# Patient Record
Sex: Female | Born: 1970 | Race: White | Hispanic: No | Marital: Married | Smoking: Never smoker
Health system: Southern US, Community
[De-identification: ages and names within clinical notes are randomized; demographics above are authoritative.]

## PROBLEM LIST (undated history)

## (undated) DIAGNOSIS — E039 Hypothyroidism, unspecified: Secondary | ICD-10-CM

## (undated) DIAGNOSIS — E785 Hyperlipidemia, unspecified: Secondary | ICD-10-CM

## (undated) DIAGNOSIS — L4050 Arthropathic psoriasis, unspecified: Secondary | ICD-10-CM

## (undated) DIAGNOSIS — E079 Disorder of thyroid, unspecified: Secondary | ICD-10-CM

## (undated) HISTORY — DX: Disorder of thyroid, unspecified: E07.9

## (undated) HISTORY — PX: BREAST REDUCTION SURGERY: SHX8

## (undated) HISTORY — DX: Hyperlipidemia, unspecified: E78.5

## (undated) HISTORY — PX: ANTERIOR CRUCIATE LIGAMENT REPAIR: SHX115

## (undated) HISTORY — PX: APPENDECTOMY: SHX54

---

## 1998-12-06 HISTORY — PX: REDUCTION MAMMAPLASTY: SUR839

## 2002-07-20 ENCOUNTER — Other Ambulatory Visit: Admission: RE | Admit: 2002-07-20 | Discharge: 2002-07-20 | Payer: Self-pay | Admitting: Family Medicine

## 2002-07-23 ENCOUNTER — Other Ambulatory Visit: Admission: RE | Admit: 2002-07-23 | Discharge: 2002-07-23 | Payer: Self-pay | Admitting: Family Medicine

## 2012-02-11 ENCOUNTER — Ambulatory Visit
Admission: RE | Admit: 2012-02-11 | Discharge: 2012-02-11 | Disposition: A | Discharge status: Home / Self Care | Payer: 59 | Source: Ambulatory Visit | Attending: Internal Medicine | Admitting: Internal Medicine

## 2012-02-11 ENCOUNTER — Other Ambulatory Visit: Payer: Self-pay | Admitting: Internal Medicine

## 2012-02-11 DIAGNOSIS — Z79899 Other long term (current) drug therapy: Secondary | ICD-10-CM

## 2013-08-09 ENCOUNTER — Other Ambulatory Visit: Payer: Self-pay

## 2013-08-09 DIAGNOSIS — Z1231 Encounter for screening mammogram for malignant neoplasm of breast: Secondary | ICD-10-CM

## 2013-08-22 ENCOUNTER — Ambulatory Visit
Admission: RE | Admit: 2013-08-22 | Discharge: 2013-08-22 | Disposition: A | Discharge status: Home / Self Care | Payer: 59 | Source: Ambulatory Visit

## 2013-08-22 DIAGNOSIS — Z1231 Encounter for screening mammogram for malignant neoplasm of breast: Secondary | ICD-10-CM

## 2013-08-24 ENCOUNTER — Ambulatory Visit: Payer: 59

## 2013-11-26 ENCOUNTER — Encounter: Payer: Self-pay | Admitting: Physician Assistant

## 2013-11-26 ENCOUNTER — Ambulatory Visit (INDEPENDENT_AMBULATORY_CARE_PROVIDER_SITE_OTHER): Payer: 59 | Admitting: Physician Assistant

## 2013-11-26 VITALS — BP 132/80 | HR 89 | Ht 68.0 in | Wt 179.0 lb

## 2013-11-26 DIAGNOSIS — E039 Hypothyroidism, unspecified: Secondary | ICD-10-CM | POA: Insufficient documentation

## 2013-11-26 DIAGNOSIS — Z79899 Other long term (current) drug therapy: Secondary | ICD-10-CM

## 2013-11-26 DIAGNOSIS — E785 Hyperlipidemia, unspecified: Secondary | ICD-10-CM

## 2013-11-26 DIAGNOSIS — G4700 Insomnia, unspecified: Secondary | ICD-10-CM

## 2013-11-26 MED ORDER — LEVOTHYROXINE SODIUM 88 MCG PO TABS: 88.0000 ug | ORAL_TABLET | Freq: Every day | ORAL | Status: DC

## 2013-11-26 MED ORDER — ZOLPIDEM TARTRATE 10 MG PO TABS: 10.0000 mg | ORAL_TABLET | Freq: Every evening | ORAL | Status: DC | PRN

## 2013-11-27 LAB — COMPLETE METABOLIC PANEL WITH GFR
ALT: 22 U/L (ref 0–35)
AST: 18 U/L (ref 0–37)
Alkaline Phosphatase: 64 U/L (ref 39–117)
CO2: 25 mEq/L (ref 19–32)
Calcium: 9.5 mg/dL (ref 8.4–10.5)
GFR, Est Non African American: >89 mL/min
Glucose, Bld: 92 mg/dL (ref 70–99)
Potassium: 4.6 mEq/L (ref 3.5–5.3)
Sodium: 136 mEq/L (ref 135–145)
Total Bilirubin: 0.4 mg/dL (ref 0.3–1.2)
Total Protein: 7.1 g/dL (ref 6.0–8.3)

## 2013-11-27 LAB — TSH: TSH: 2.255 u[IU]/mL (ref 0.350–4.500)

## 2013-11-27 LAB — LIPID PANEL
HDL: 65 mg/dL (ref >39)
LDL Cholesterol: 129 mg/dL — ABNORMAL HIGH (ref 0–99)
Total CHOL/HDL Ratio: 3.4 Ratio
VLDL: 30 mg/dL (ref 0–40)

## 2013-11-28 NOTE — Progress Notes (Signed)
   Subjective:    Patient ID: Summer Santiago, female    DOB: 08-16-1971, 42 y.o.   MRN: [id number]  HPI Patient is a 42 yo WF who presents to the clinic to establish care. Pt needs meds refilled and to start lab work in our office.   Hypothyroidism has been controlled for many years on same dose. No compliants today. Has not been checked in a while.   Insomnia- used ambien for a while to help sleep. Travels a lot and needs it to help her sleep.   Hyperlipidemia- Has not been checked in a while. Takes zetia daily. Tried zocor and lipitor and not able to tolerate due to muscle aches and pain.   .. Family History  Problem Relation Age of Onset  . Congestive Heart Failure Mother   . Gout Father   . Diabetes Maternal Grandmother   . Diabetes Paternal Grandmother   . Cancer Paternal Grandmother     non-lymphoma of the brain  . Stroke Paternal Grandfather    .Marland Kitchen Past Surgical History  Procedure Laterality Date  . Appendectomy    . Breast reduction surgery        Review of Systems  All other systems reviewed and are negative.       Objective:   Physical Exam  Constitutional: She is oriented to person, place, and time. She appears well-developed and well-nourished.  HENT:  Head: Normocephalic and atraumatic.  Neck: Normal range of motion. Neck supple. No thyromegaly present.  Cardiovascular: Normal rate, regular rhythm and normal heart sounds.   Pulmonary/Chest: Effort normal and breath sounds normal.  Lymphadenopathy:    She has no cervical adenopathy.  Neurological: She is alert and oriented to person, place, and time.  Skin: Skin is warm and dry.  Psychiatric: She has a normal mood and affect. Her behavior is normal.          Assessment & Plan:  Hypothyroidism- Will recheck TSH today. Refilled medication. Follow up in 6 months.   Insomnia- refilled ambien.   Hyperlipidemia- Will recheck lipid level. Refilled zetia.   Flu shot and tetanus shot up to date.

## 2013-12-11 ENCOUNTER — Ambulatory Visit (INDEPENDENT_AMBULATORY_CARE_PROVIDER_SITE_OTHER): Payer: 59 | Admitting: Family Medicine

## 2013-12-11 ENCOUNTER — Encounter: Payer: Self-pay | Admitting: Family Medicine

## 2013-12-11 VITALS — BP 143/100 | Temp 97.6°F | Wt 180.0 lb

## 2013-12-11 DIAGNOSIS — J329 Chronic sinusitis, unspecified: Secondary | ICD-10-CM

## 2013-12-11 DIAGNOSIS — B9689 Other specified bacterial agents as the cause of diseases classified elsewhere: Secondary | ICD-10-CM

## 2013-12-11 DIAGNOSIS — A499 Bacterial infection, unspecified: Secondary | ICD-10-CM

## 2013-12-11 MED ORDER — AMOXICILLIN-POT CLAVULANATE 500-125 MG PO TABS: ORAL_TABLET | ORAL | Status: AC

## 2013-12-11 NOTE — Progress Notes (Signed)
CC: Summer Santiago is a 43 y.o. female is here for Cough and Nasal Congestion   Subjective: HPI:  Facial pressure underneath the left eye radiating into the left upper molars that has been present for the past 3 weeks waxing and waning worsening on a daily basis for the past week. Accompanied with thick nasal discharge with subjective postnasal drip.  Nonproductive cough all the symptoms are worse in the evening when lying down flat. No 2 minimal improvement with DayQuil and NyQuil.  Reports fatigue but denies fevers, chills, shortness of breath, wheezing, chest pain nor motor or sensory disturbances   Review Of Systems Outlined In HPI  Past Medical History  Diagnosis Date  . Hyperlipidemia   . Thyroid disease      Family History  Problem Relation Age of Onset  . Congestive Heart Failure Mother   . Gout Father   . Diabetes Maternal Grandmother   . Diabetes Paternal Grandmother   . Cancer Paternal Grandmother     non-lymphoma of the brain  . Stroke Paternal Grandfather      History  Substance Use Topics  . Smoking status: Never Smoker   . Smokeless tobacco: Not on file  . Alcohol Use: Yes     Objective: Filed Vitals:   12/11/13 0901  BP: 143/100  Temp: 97.6 F (36.4 C)    General: Alert and Oriented, No Acute Distress HEENT: Pupils equal, round, reactive to light. Conjunctivae clear.  External ears unremarkable, canals clear with intact TMs with appropriate landmarks.  Middle ear appears open without effusion. Pink inferior turbinates.  Moist mucous membranes, pharynx without inflammation nor lesions however moderate cobblestoning and postnasal drip.  Neck supple without palpable lymphadenopathy nor abnormal masses. Maxillary sinus tenderness Lungs: Clear to auscultation bilaterally, no wheezing/ronchi/rales.  Comfortable work of breathing. Good air movement. Cardiac: Regular rate and rhythm. Normal S1/S2.  No murmurs, rubs, nor gallops.   Mental Status: No depression,  anxiety, nor agitation. Skin: Warm and dry.  Assessment & Plan: Summer Santiago was seen today for cough and nasal congestion.  Diagnoses and associated orders for this visit:  Bacterial sinusitis - amoxicillin-clavulanate (AUGMENTIN) 500-125 MG per tablet; Take one by mouth every 8 hours for ten total days.     bacterial sinsitis: Start Augmentin consider nasal saline washes would agree with continuing DayQuil and NyQuil as needed  Return if symptoms worsen or fail to improve.

## 2013-12-27 ENCOUNTER — Encounter: Payer: Self-pay | Admitting: Physician Assistant

## 2013-12-27 DIAGNOSIS — E559 Vitamin D deficiency, unspecified: Secondary | ICD-10-CM | POA: Insufficient documentation

## 2014-01-30 ENCOUNTER — Telehealth: Payer: Self-pay | Admitting: *Deleted

## 2014-01-30 NOTE — Telephone Encounter (Signed)
Prior auth approved for Zetia through [facility]. Auth approved from 01/29/14-01/29/15.

## 2014-04-07 ENCOUNTER — Other Ambulatory Visit: Payer: Self-pay | Admitting: Physician Assistant

## 2014-05-20 ENCOUNTER — Ambulatory Visit: Payer: 59 | Admitting: Physician Assistant

## 2014-05-24 ENCOUNTER — Ambulatory Visit: Payer: 59 | Admitting: Physician Assistant

## 2014-05-31 ENCOUNTER — Ambulatory Visit: Payer: 59 | Admitting: Physician Assistant

## 2014-06-06 ENCOUNTER — Other Ambulatory Visit: Payer: Self-pay | Admitting: Physician Assistant

## 2014-06-10 ENCOUNTER — Ambulatory Visit (INDEPENDENT_AMBULATORY_CARE_PROVIDER_SITE_OTHER): Payer: 59 | Admitting: Physician Assistant

## 2014-06-10 ENCOUNTER — Encounter: Payer: Self-pay | Admitting: Physician Assistant

## 2014-06-10 VITALS — BP 125/86 | HR 85 | Ht 68.0 in | Wt 181.0 lb

## 2014-06-10 DIAGNOSIS — G4700 Insomnia, unspecified: Secondary | ICD-10-CM

## 2014-06-10 DIAGNOSIS — E039 Hypothyroidism, unspecified: Secondary | ICD-10-CM

## 2014-06-10 DIAGNOSIS — E785 Hyperlipidemia, unspecified: Secondary | ICD-10-CM

## 2014-06-10 MED ORDER — ZOLPIDEM TARTRATE 10 MG PO TABS: 10.0000 mg | ORAL_TABLET | Freq: Every evening | ORAL | Status: DC | PRN

## 2014-06-10 MED ORDER — EZETIMIBE 10 MG PO TABS: 10.0000 mg | ORAL_TABLET | Freq: Every day | ORAL | Status: DC

## 2014-06-10 NOTE — Progress Notes (Signed)
   Subjective:    Patient ID: Summer Santiago, female    DOB: 03/28/1971, 43 y.o.   MRN: [id number]  HPI  Patient is a 43 year old female who presents to the clinic for 6 month followup.  Hypothyroidism-patient has no concerns or complaints today. She continues to take Synthroid 88 MCG use daily.  Hyperlipidemia-patient has intolerance to statins. Currently owns and fish oil. Last LDL was 129. She does need refills of that area today.  Insomnia-patient is well controlled on Ambien 10 mg nightly. She needs refill today.   Review of Systems  All other systems reviewed and are negative.      Objective:   Physical Exam  Constitutional: She appears well-developed and well-nourished.  HENT:  Head: Normocephalic and atraumatic.  Cardiovascular: Normal rate, regular rhythm and normal heart sounds.   Psychiatric: She has a normal mood and affect. Her behavior is normal.          Assessment & Plan:  Hypothyroidism-we'll recheck thyroid levels today. We'll refill accordingly.  Hyperlipidemia-refilled setting up for 6 month. We'll recheck cholesterol in December.  Insomnia-refilled Ambien. Patient needed a 30 day supply to get filled now and the rest to care [employer] [facility] order pharmacy.  Elevated blood pressure-rechecked and was in normal range at 6 recheck.

## 2014-06-11 ENCOUNTER — Other Ambulatory Visit: Payer: Self-pay | Admitting: Physician Assistant

## 2014-06-11 LAB — T4, FREE: FREE T4: 1.16 ng/dL (ref 0.80–1.80)

## 2014-06-11 LAB — TSH: TSH: 3.771 u[IU]/mL (ref 0.350–4.500)

## 2014-06-11 MED ORDER — LEVOTHYROXINE SODIUM 88 MCG PO TABS: 88.0000 ug | ORAL_TABLET | Freq: Every day | ORAL | Status: DC

## 2014-11-13 ENCOUNTER — Encounter: Payer: Self-pay | Admitting: Physician Assistant

## 2014-11-13 ENCOUNTER — Telehealth: Payer: Self-pay | Admitting: *Deleted

## 2014-11-13 ENCOUNTER — Ambulatory Visit (INDEPENDENT_AMBULATORY_CARE_PROVIDER_SITE_OTHER): Payer: 59 | Admitting: Physician Assistant

## 2014-11-13 VITALS — BP 134/81 | HR 50 | Ht 68.0 in | Wt 166.0 lb

## 2014-11-13 DIAGNOSIS — G4700 Insomnia, unspecified: Secondary | ICD-10-CM

## 2014-11-13 DIAGNOSIS — L719 Rosacea, unspecified: Secondary | ICD-10-CM

## 2014-11-13 DIAGNOSIS — E039 Hypothyroidism, unspecified: Secondary | ICD-10-CM

## 2014-11-13 DIAGNOSIS — Z0000 Encounter for general adult medical examination without abnormal findings: Secondary | ICD-10-CM

## 2014-11-13 LAB — COMPLETE METABOLIC PANEL WITH GFR
ALT: 20 U/L (ref 0–35)
AST: 17 U/L (ref 0–37)
Albumin: 4.1 g/dL (ref 3.5–5.2)
Alkaline Phosphatase: 66 U/L (ref 39–117)
BUN: 12 mg/dL (ref 6–23)
CALCIUM: 9 mg/dL (ref 8.4–10.5)
CHLORIDE: 103 meq/L (ref 96–112)
CO2: 24 mEq/L (ref 19–32)
Creat: 0.79 mg/dL (ref 0.50–1.10)
GFR, Est African American: >89 mL/min
GFR, Est Non African American: >89 mL/min
Glucose, Bld: 83 mg/dL (ref 70–99)
POTASSIUM: 4.4 meq/L (ref 3.5–5.3)
SODIUM: 138 meq/L (ref 135–145)
Total Bilirubin: 0.4 mg/dL (ref 0.2–1.2)
Total Protein: 7 g/dL (ref 6.0–8.3)

## 2014-11-13 LAB — LIPID PANEL
CHOLESTEROL: 191 mg/dL (ref 0–200)
HDL: 73 mg/dL (ref >39)
LDL Cholesterol: 102 mg/dL — ABNORMAL HIGH (ref 0–99)
Total CHOL/HDL Ratio: 2.6 Ratio
Triglycerides: 80 mg/dL (ref <150)
VLDL: 16 mg/dL (ref 0–40)

## 2014-11-13 LAB — TSH: TSH: 5.404 u[IU]/mL — ABNORMAL HIGH (ref 0.350–4.500)

## 2014-11-13 LAB — T4, FREE: FREE T4: 1.27 ng/dL (ref 0.80–1.80)

## 2014-11-13 MED ORDER — ZOLPIDEM TARTRATE 10 MG PO TABS: 10.0000 mg | ORAL_TABLET | Freq: Every evening | ORAL | Status: DC | PRN

## 2014-11-13 NOTE — Progress Notes (Signed)
   Subjective:    Patient ID: Summer Santiago, female    DOB: [birth year], 43 y.o.   MRN: [id number]  HPI  Pt presents to the clinic for 6 month follow up and medication refill.   Hypothyroidism- doing great. No concerns. Last checked 6 months ago.   Insomnia- still uses ambien nightly. Needs refills. Travels a lot and has trouble sleeping.   Review of Systems  All other systems reviewed and are negative.      Objective:   Physical Exam  Constitutional: She is oriented to person, place, and time. She appears well-developed and well-nourished.  HENT:  Head: Normocephalic and atraumatic.  Neck: Normal range of motion. Neck supple. No thyromegaly present.  Cardiovascular: Normal rate, regular rhythm and normal heart sounds.   Pulmonary/Chest: Effort normal and breath sounds normal. She has no wheezes.  Neurological: She is alert and oriented to person, place, and time.  Skin: Skin is dry.  Psychiatric: She has a normal mood and affect. Her behavior is normal.          Assessment & Plan:  Hypothyroidism- will recheck labs and adjust medication accordingly.   Insomnia- refilled for 6 months.   Rosacea- new dx. Managed by dermatology.    Needs CPE and mammogram. Pt has gotten letter from breast clinic she just needs to reschedule.

## 2014-11-13 NOTE — Telephone Encounter (Signed)
Fasting labs ordered

## 2014-11-19 ENCOUNTER — Other Ambulatory Visit: Payer: Self-pay | Admitting: *Deleted

## 2014-11-19 MED ORDER — EZETIMIBE 10 MG PO TABS: 10.0000 mg | ORAL_TABLET | Freq: Every day | ORAL | Status: DC

## 2014-11-19 MED ORDER — LEVOTHYROXINE SODIUM 88 MCG PO TABS: 88.0000 ug | ORAL_TABLET | Freq: Every day | ORAL | Status: DC

## 2015-01-17 ENCOUNTER — Other Ambulatory Visit: Payer: Self-pay | Admitting: *Deleted

## 2015-01-17 MED ORDER — ZOLPIDEM TARTRATE 10 MG PO TABS: 10.0000 mg | ORAL_TABLET | Freq: Every evening | ORAL | Status: DC | PRN

## 2015-02-14 ENCOUNTER — Ambulatory Visit (INDEPENDENT_AMBULATORY_CARE_PROVIDER_SITE_OTHER): Payer: 59 | Admitting: Physician Assistant

## 2015-02-14 ENCOUNTER — Encounter: Payer: Self-pay | Admitting: Physician Assistant

## 2015-02-14 VITALS — BP 142/85 | HR 56 | Ht 68.0 in | Wt 164.0 lb

## 2015-02-14 DIAGNOSIS — R1900 Intra-abdominal and pelvic swelling, mass and lump, unspecified site: Secondary | ICD-10-CM

## 2015-02-14 NOTE — Progress Notes (Signed)
   Subjective:    Patient ID: Summer Santiago, female    DOB: [birth year], 44 y.o.   MRN: [id number]  HPI  Pt presents to the clinic with a pelvic mass for last month. Per pt seems about the size of a baseball and moves when her bladder is full. No pain associated or tenderness to palpation. No urinary symptoms. No vaginal bleeding except for normal menstrual cycle with some clots that is unchanged. No fever, chills, night sweats. Unaware if increasing in size or not.    Review of Systems  All other systems reviewed and are negative.      Objective:   Physical Exam  Constitutional: She is oriented to person, place, and time. She appears well-developed and well-nourished.  HENT:  Head: Normocephalic and atraumatic.  Cardiovascular: Normal rate, regular rhythm and normal heart sounds.   Pulmonary/Chest: Effort normal and breath sounds normal. She has no wheezes.  Abdominal:  Pelvic mass approximately 5cm by 4cm lower abdomen slightly to the right. Not tender.   Neurological: She is alert and oriented to person, place, and time.  Skin: Skin is dry.  Psychiatric: She has a normal mood and affect. Her behavior is normal.          Assessment & Plan:  Pelvic mass- will get pelvic and transvaginal ultrasound. Does not feel like lymphnode. Unclear etiology could be fibroid vs other mass. Follow up after ultrasound as directed by results.

## 2015-02-28 ENCOUNTER — Ambulatory Visit
Admission: RE | Admit: 2015-02-28 | Discharge: 2015-02-28 | Disposition: A | Discharge status: Home / Self Care | Payer: 59 | Source: Ambulatory Visit | Attending: Physician Assistant | Admitting: Physician Assistant

## 2015-02-28 ENCOUNTER — Ambulatory Visit: Admission: RE | Admit: 2015-02-28 | Payer: 59 | Source: Ambulatory Visit

## 2015-03-03 ENCOUNTER — Encounter: Payer: Self-pay | Admitting: Physician Assistant

## 2015-03-03 DIAGNOSIS — D259 Leiomyoma of uterus, unspecified: Secondary | ICD-10-CM | POA: Insufficient documentation

## 2015-03-04 ENCOUNTER — Other Ambulatory Visit: Payer: Self-pay | Admitting: Physician Assistant

## 2015-03-04 DIAGNOSIS — D259 Leiomyoma of uterus, unspecified: Secondary | ICD-10-CM

## 2015-04-04 ENCOUNTER — Other Ambulatory Visit: Payer: Self-pay

## 2015-04-04 DIAGNOSIS — Z1231 Encounter for screening mammogram for malignant neoplasm of breast: Secondary | ICD-10-CM

## 2015-04-10 ENCOUNTER — Ambulatory Visit
Admission: RE | Admit: 2015-04-10 | Discharge: 2015-04-10 | Disposition: A | Discharge status: Home / Self Care | Payer: 59 | Source: Ambulatory Visit

## 2015-04-10 DIAGNOSIS — Z1231 Encounter for screening mammogram for malignant neoplasm of breast: Secondary | ICD-10-CM

## 2015-04-24 ENCOUNTER — Telehealth: Payer: Self-pay | Admitting: Physician Assistant

## 2015-04-24 NOTE — Telephone Encounter (Signed)
Received fax from [facility] and Zetia was approved from 04/23/2015 -04/22/2016. 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 - CF

## 2015-04-28 ENCOUNTER — Other Ambulatory Visit: Payer: Self-pay | Admitting: Obstetrics

## 2015-05-01 NOTE — Patient Instructions (Addendum)
   Your procedure is scheduled on:  Friday, June 3  Enter through the [employer] [facility] at: 7 AM Pick up the phone at the desk and dial [phone] and inform us of your arrival.  Please call this number if you have any problems the morning of surgery: [phone]  Remember: Do not eat or drink after midnight: Thursday Take these medicines the morning of surgery with a SIP OF WATER:  None  Do not wear jewelry, make-up, or FINGER nail polish No metal in your hair or on your body. Do not wear lotions, powders, perfumes.  You may wear deodorant.  Do not bring valuables to the hospital.  Leave suitcase in the car. After Surgery it may be brought to your room. For patients being admitted to the hospital, checkout time is 11:00am the day of discharge.  Home with husband BOB cell [phone]

## 2015-05-02 ENCOUNTER — Encounter (HOSPITAL_COMMUNITY): Payer: Self-pay

## 2015-05-02 ENCOUNTER — Encounter (HOSPITAL_COMMUNITY)
Admission: RE | Admit: 2015-05-02 | Discharge: 2015-05-02 | Disposition: A | Discharge status: Home / Self Care | Payer: 59 | Source: Ambulatory Visit | Attending: Obstetrics | Admitting: Obstetrics

## 2015-05-02 DIAGNOSIS — E039 Hypothyroidism, unspecified: Secondary | ICD-10-CM | POA: Diagnosis not present

## 2015-05-02 DIAGNOSIS — E785 Hyperlipidemia, unspecified: Secondary | ICD-10-CM | POA: Diagnosis not present

## 2015-05-02 DIAGNOSIS — E780 Pure hypercholesterolemia: Secondary | ICD-10-CM | POA: Diagnosis not present

## 2015-05-02 DIAGNOSIS — D259 Leiomyoma of uterus, unspecified: Secondary | ICD-10-CM | POA: Diagnosis present

## 2015-05-02 DIAGNOSIS — Z01818 Encounter for other preprocedural examination: Secondary | ICD-10-CM | POA: Insufficient documentation

## 2015-05-02 DIAGNOSIS — Z79899 Other long term (current) drug therapy: Secondary | ICD-10-CM | POA: Diagnosis not present

## 2015-05-02 HISTORY — DX: Hypothyroidism, unspecified: E03.9

## 2015-05-02 LAB — CBC
HCT: 41.7 % (ref 36.0–46.0)
Hemoglobin: 14.8 g/dL (ref 12.0–15.0)
MCH: 32.5 pg (ref 26.0–34.0)
MCHC: 35.5 g/dL (ref 30.0–36.0)
MCV: 91.4 fL (ref 78.0–100.0)
PLATELETS: 198 10*3/uL (ref 150–400)
RBC: 4.56 MIL/uL (ref 3.87–5.11)
RDW: 14.9 % (ref 11.5–15.5)
WBC: 8 10*3/uL (ref 4.0–10.5)

## 2015-05-02 LAB — TYPE AND SCREEN
ABO/RH(D): O POS
Antibody Screen: NEGATIVE

## 2015-05-02 LAB — ABO/RH: ABO/RH(D): O POS

## 2015-05-08 NOTE — H&P (Signed)
CC: fibroid uterus  HPI: 44 yo  G0, not desiring fertility, with 3 month history of palpable abdominal mass. Also w/ h/o heavy but regular 5 day menses. No anemia, no dyspareunia. Pt bothered by mass effect. U/s reveals 14 x7 x 9 cm uterus with central fibroi 8x6x8 cm. Imaging and labs 4/16. Hgb 13+, nl pap, HPV neg, nl embx.  FH: no FH breast, ovarian, colon CA PMH: hypothyroid, hyperchol PSH: knee, breast reduction, appendectomy.   Meds: Ambien, Synthroid, Zetia.  Past Medical History  Diagnosis Date  . Hyperlipidemia   . Thyroid disease   . Hypothyroidism     Past Surgical History  Procedure Laterality Date  . Appendectomy    . Breast reduction surgery    . Anterior cruciate ligament repair      right     PE:  Filed Vitals:   05/09/15 0713  BP: 159/84  Pulse: 69  Temp: 98.4 F (36.9 C)  TempSrc: Oral  Resp: 16  SpO2: 100%   Gen: well appearing, no distress Abd: soft, NT LE: Nt, no edema GU: def to OR   CBC    Component Value Date/Time   WBC 8.0 05/02/2015 1545   RBC 4.56 05/02/2015 1545   HGB 14.8 05/02/2015 1545   HCT 41.7 05/02/2015 1545   PLT 198 05/02/2015 1545   MCV 91.4 05/02/2015 1545   MCH 32.5 05/02/2015 1545   MCHC 35.5 05/02/2015 1545   RDW 14.9 05/02/2015 1545    UPT neg  A/P: Fibroid with mass effect, Pt opting for surgical management. Pt aware R/B of surgery including need for exploratory laparotomy with increased recovery time, blood loss, pain; bleeding, infection, damage to other organs. We have also discussed that uterine sarcoma is also in the DDx- this particular tumor is usually very aggressive, with a lower life expectancy than many gyn cancers. Pt is aware of the option for MRI and gyn oncology consultation pre-op but has declines these options. We have discussed the risks of inadvertant spread of a possible sarcoma with l'scopic approach and with power morcellation. Open approach would be the alternative but pt declines as she is  hoping for a minimally invlasive surgery with less recovery time. Pt opts for power morcellation in the event the uterus cannot be removed vaginally.

## 2015-05-09 ENCOUNTER — Ambulatory Visit (HOSPITAL_COMMUNITY)
Admission: RE | Admit: 2015-05-09 | Discharge: 2015-05-10 | Disposition: A | Discharge status: Home / Self Care | Payer: 59 | Source: Ambulatory Visit | Attending: Obstetrics | Admitting: Obstetrics

## 2015-05-09 ENCOUNTER — Encounter (HOSPITAL_COMMUNITY)
Admission: RE | Disposition: A | Discharge status: Home / Self Care | Payer: Self-pay | Source: Ambulatory Visit | Attending: Obstetrics

## 2015-05-09 ENCOUNTER — Ambulatory Visit (HOSPITAL_COMMUNITY): Payer: 59 | Admitting: Anesthesiology

## 2015-05-09 ENCOUNTER — Encounter (HOSPITAL_COMMUNITY): Payer: Self-pay | Admitting: Anesthesiology

## 2015-05-09 DIAGNOSIS — E785 Hyperlipidemia, unspecified: Secondary | ICD-10-CM | POA: Insufficient documentation

## 2015-05-09 DIAGNOSIS — D259 Leiomyoma of uterus, unspecified: Secondary | ICD-10-CM | POA: Diagnosis not present

## 2015-05-09 DIAGNOSIS — Z79899 Other long term (current) drug therapy: Secondary | ICD-10-CM | POA: Insufficient documentation

## 2015-05-09 DIAGNOSIS — E780 Pure hypercholesterolemia: Secondary | ICD-10-CM | POA: Insufficient documentation

## 2015-05-09 DIAGNOSIS — E039 Hypothyroidism, unspecified: Secondary | ICD-10-CM | POA: Insufficient documentation

## 2015-05-09 DIAGNOSIS — D219 Benign neoplasm of connective and other soft tissue, unspecified: Secondary | ICD-10-CM | POA: Diagnosis present

## 2015-05-09 HISTORY — PX: BILATERAL SALPINGECTOMY: SHX5743

## 2015-05-09 HISTORY — PX: ROBOTIC ASSISTED TOTAL HYSTERECTOMY: SHX6085

## 2015-05-09 LAB — TYPE AND SCREEN
ABO/RH(D): O POS
Antibody Screen: NEGATIVE

## 2015-05-09 LAB — PREGNANCY, URINE: PREG TEST UR: NEGATIVE

## 2015-05-09 SURGERY — ROBOTIC ASSISTED TOTAL HYSTERECTOMY
Anesthesia: General | Site: Abdomen

## 2015-05-09 MED ORDER — HYDROCODONE-ACETAMINOPHEN 7.5-325 MG PO TABS: 1.0000 | ORAL_TABLET | Freq: Once | ORAL | Status: DC | PRN

## 2015-05-09 MED ORDER — CEFAZOLIN SODIUM-DEXTROSE 2-3 GM-% IV SOLR
2.0000 g | INTRAVENOUS | Status: AC
  Administered 2015-05-09: 2 g via INTRAVENOUS

## 2015-05-09 MED ORDER — FENTANYL CITRATE (PF) 250 MCG/5ML IJ SOLN
INTRAMUSCULAR | Status: AC
  Filled 2015-05-09: qty 5

## 2015-05-09 MED ORDER — NEOSTIGMINE METHYLSULFATE 10 MG/10ML IV SOLN
INTRAVENOUS | Status: DC | PRN
  Administered 2015-05-09: 4 mg via INTRAVENOUS

## 2015-05-09 MED ORDER — HYDROMORPHONE HCL 1 MG/ML IJ SOLN
0.2500 mg | INTRAMUSCULAR | Status: DC | PRN
  Administered 2015-05-09 (×2): 0.5 mg via INTRAVENOUS

## 2015-05-09 MED ORDER — ZOLPIDEM TARTRATE 5 MG PO TABS: 5.0000 mg | ORAL_TABLET | Freq: Every evening | ORAL | Status: DC | PRN

## 2015-05-09 MED ORDER — SODIUM CHLORIDE 0.9 % IV SOLN
INTRAVENOUS | Status: DC
  Administered 2015-05-09 – 2015-05-10 (×2): via INTRAVENOUS

## 2015-05-09 MED ORDER — SIMETHICONE 80 MG PO CHEW: 80.0000 mg | CHEWABLE_TABLET | Freq: Four times a day (QID) | ORAL | Status: DC | PRN

## 2015-05-09 MED ORDER — SODIUM CHLORIDE 0.9 % IV SOLN
INTRAVENOUS | Status: DC | PRN
  Administered 2015-05-09: 60 mL

## 2015-05-09 MED ORDER — MENTHOL 3 MG MT LOZG: 1.0000 | LOZENGE | OROMUCOSAL | Status: DC | PRN

## 2015-05-09 MED ORDER — ROPIVACAINE HCL 5 MG/ML IJ SOLN
INTRAMUSCULAR | Status: AC
  Filled 2015-05-09: qty 30

## 2015-05-09 MED ORDER — OXYCODONE-ACETAMINOPHEN 5-325 MG PO TABS
1.0000 | ORAL_TABLET | ORAL | Status: DC | PRN
  Administered 2015-05-10: 1 via ORAL
  Filled 2015-05-09: qty 1

## 2015-05-09 MED ORDER — ROCURONIUM BROMIDE 100 MG/10ML IV SOLN
INTRAVENOUS | Status: DC | PRN
  Administered 2015-05-09: 5 mg via INTRAVENOUS
  Administered 2015-05-09 (×2): 20 mg via INTRAVENOUS
  Administered 2015-05-09: 5 mg via INTRAVENOUS
  Administered 2015-05-09: 10 mg via INTRAVENOUS
  Administered 2015-05-09: 45 mg via INTRAVENOUS

## 2015-05-09 MED ORDER — IBUPROFEN 600 MG PO TABS: 600.0000 mg | ORAL_TABLET | Freq: Four times a day (QID) | ORAL | Status: DC | PRN

## 2015-05-09 MED ORDER — ONDANSETRON HCL 4 MG/2ML IJ SOLN
INTRAMUSCULAR | Status: AC
  Filled 2015-05-09: qty 2

## 2015-05-09 MED ORDER — BUPIVACAINE HCL (PF) 0.25 % IJ SOLN
INTRAMUSCULAR | Status: AC
  Filled 2015-05-09: qty 30

## 2015-05-09 MED ORDER — LABETALOL HCL 5 MG/ML IV SOLN
INTRAVENOUS | Status: AC
Start: 2015-05-09 — End: 2015-05-09
  Filled 2015-05-09: qty 4

## 2015-05-09 MED ORDER — ONDANSETRON HCL 4 MG/2ML IJ SOLN
INTRAMUSCULAR | Status: DC | PRN
  Administered 2015-05-09: 4 mg via INTRAVENOUS

## 2015-05-09 MED ORDER — LIDOCAINE HCL (CARDIAC) 20 MG/ML IV SOLN
INTRAVENOUS | Status: AC
  Filled 2015-05-09: qty 5

## 2015-05-09 MED ORDER — ARTIFICIAL TEARS OP OINT
TOPICAL_OINTMENT | OPHTHALMIC | Status: AC
  Filled 2015-05-09: qty 3.5

## 2015-05-09 MED ORDER — SODIUM CHLORIDE 0.9 % IJ SOLN
INTRAMUSCULAR | Status: AC
  Filled 2015-05-09: qty 50

## 2015-05-09 MED ORDER — ONDANSETRON HCL 4 MG/2ML IJ SOLN: 4.0000 mg | Freq: Four times a day (QID) | INTRAMUSCULAR | Status: DC | PRN

## 2015-05-09 MED ORDER — BUPIVACAINE HCL (PF) 0.25 % IJ SOLN
INTRAMUSCULAR | Status: DC | PRN
  Administered 2015-05-09: 30 mL

## 2015-05-09 MED ORDER — SCOPOLAMINE 1 MG/3DAYS TD PT72
MEDICATED_PATCH | TRANSDERMAL | Status: AC
  Administered 2015-05-09: 1.5 mg via TRANSDERMAL
  Filled 2015-05-09: qty 1

## 2015-05-09 MED ORDER — ROCURONIUM BROMIDE 100 MG/10ML IV SOLN
INTRAVENOUS | Status: AC
  Filled 2015-05-09: qty 1

## 2015-05-09 MED ORDER — MIDAZOLAM HCL 2 MG/2ML IJ SOLN
INTRAMUSCULAR | Status: AC
  Filled 2015-05-09: qty 2

## 2015-05-09 MED ORDER — FENTANYL CITRATE (PF) 100 MCG/2ML IJ SOLN
INTRAMUSCULAR | Status: DC | PRN
  Administered 2015-05-09 (×3): 50 ug via INTRAVENOUS
  Administered 2015-05-09 (×3): 100 ug via INTRAVENOUS
  Administered 2015-05-09: 50 ug via INTRAVENOUS

## 2015-05-09 MED ORDER — METOCLOPRAMIDE HCL 5 MG/ML IJ SOLN: 10.0000 mg | Freq: Once | INTRAMUSCULAR | Status: DC | PRN

## 2015-05-09 MED ORDER — PROPOFOL 10 MG/ML IV BOLUS
INTRAVENOUS | Status: AC
  Filled 2015-05-09: qty 20

## 2015-05-09 MED ORDER — MIDAZOLAM HCL 5 MG/5ML IJ SOLN
INTRAMUSCULAR | Status: DC | PRN
  Administered 2015-05-09: 2 mg via INTRAVENOUS

## 2015-05-09 MED ORDER — PROPOFOL INFUSION 10 MG/ML OPTIME
INTRAVENOUS | Status: DC | PRN
  Administered 2015-05-09: 15 mL via INTRAVENOUS

## 2015-05-09 MED ORDER — HYDROMORPHONE HCL 1 MG/ML IJ SOLN
INTRAMUSCULAR | Status: AC
  Filled 2015-05-09: qty 1

## 2015-05-09 MED ORDER — LACTATED RINGERS IR SOLN
Status: DC | PRN
  Administered 2015-05-09: 3000 mL

## 2015-05-09 MED ORDER — LACTATED RINGERS IV SOLN
INTRAVENOUS | Status: DC
  Administered 2015-05-09 (×3): via INTRAVENOUS

## 2015-05-09 MED ORDER — MEPERIDINE HCL 25 MG/ML IJ SOLN: 6.2500 mg | INTRAMUSCULAR | Status: DC | PRN

## 2015-05-09 MED ORDER — NEOSTIGMINE METHYLSULFATE 10 MG/10ML IV SOLN
INTRAVENOUS | Status: AC
  Filled 2015-05-09: qty 1

## 2015-05-09 MED ORDER — KETOROLAC TROMETHAMINE 30 MG/ML IJ SOLN: 30.0000 mg | Freq: Four times a day (QID) | INTRAMUSCULAR | Status: DC

## 2015-05-09 MED ORDER — BUPIVACAINE-EPINEPHRINE (PF) 0.25% -1:200000 IJ SOLN
INTRAMUSCULAR | Status: AC
  Filled 2015-05-09: qty 30

## 2015-05-09 MED ORDER — GLYCOPYRROLATE 0.2 MG/ML IJ SOLN
INTRAMUSCULAR | Status: DC | PRN
  Administered 2015-05-09: 0.1 mg via INTRAVENOUS
  Administered 2015-05-09: 0.6 mg via INTRAVENOUS
  Administered 2015-05-09: 0.1 mg via INTRAVENOUS

## 2015-05-09 MED ORDER — PROPOFOL INFUSION 10 MG/ML OPTIME: INTRAVENOUS | Status: DC | PRN

## 2015-05-09 MED ORDER — PANTOPRAZOLE SODIUM 40 MG PO TBEC
40.0000 mg | DELAYED_RELEASE_TABLET | Freq: Every day | ORAL | Status: DC
  Administered 2015-05-09: 40 mg via ORAL
  Filled 2015-05-09 (×2): qty 1

## 2015-05-09 MED ORDER — DEXAMETHASONE SODIUM PHOSPHATE 4 MG/ML IJ SOLN
INTRAMUSCULAR | Status: DC | PRN
  Administered 2015-05-09: 10 mg via INTRAVENOUS

## 2015-05-09 MED ORDER — SCOPOLAMINE 1 MG/3DAYS TD PT72
1.0000 | MEDICATED_PATCH | Freq: Once | TRANSDERMAL | Status: DC
  Administered 2015-05-09: 1.5 mg via TRANSDERMAL

## 2015-05-09 MED ORDER — LABETALOL HCL 5 MG/ML IV SOLN
INTRAVENOUS | Status: DC | PRN
  Administered 2015-05-09 (×2): 5 mg via INTRAVENOUS

## 2015-05-09 MED ORDER — ONDANSETRON HCL 4 MG PO TABS: 4.0000 mg | ORAL_TABLET | Freq: Four times a day (QID) | ORAL | Status: DC | PRN

## 2015-05-09 MED ORDER — CEFAZOLIN SODIUM-DEXTROSE 2-3 GM-% IV SOLR
INTRAVENOUS | Status: AC
  Filled 2015-05-09: qty 50

## 2015-05-09 MED ORDER — LIDOCAINE HCL (CARDIAC) 20 MG/ML IV SOLN
INTRAVENOUS | Status: DC | PRN
  Administered 2015-05-09: 50 mg via INTRAVENOUS
  Administered 2015-05-09: 30 mg via INTRAVENOUS

## 2015-05-09 MED ORDER — KETOROLAC TROMETHAMINE 30 MG/ML IJ SOLN
30.0000 mg | Freq: Four times a day (QID) | INTRAMUSCULAR | Status: DC
  Administered 2015-05-09 – 2015-05-10 (×3): 30 mg via INTRAVENOUS
  Filled 2015-05-09 (×3): qty 1

## 2015-05-09 MED ORDER — LEVOTHYROXINE SODIUM 88 MCG PO TABS
88.0000 ug | ORAL_TABLET | Freq: Every day | ORAL | Status: DC
  Administered 2015-05-09: 88 ug via ORAL
  Filled 2015-05-09: qty 1

## 2015-05-09 MED ORDER — DEXAMETHASONE SODIUM PHOSPHATE 10 MG/ML IJ SOLN
INTRAMUSCULAR | Status: AC
  Filled 2015-05-09: qty 1

## 2015-05-09 MED ORDER — HYDROMORPHONE HCL 1 MG/ML IJ SOLN
1.0000 mg | INTRAMUSCULAR | Status: DC | PRN
  Administered 2015-05-09 – 2015-05-10 (×3): 1 mg via INTRAVENOUS
  Filled 2015-05-09 (×3): qty 1

## 2015-05-09 SURGICAL SUPPLY — 57 items
BARRIER ADHS 3X4 INTERCEED (GAUZE/BANDAGES/DRESSINGS) ×3 IMPLANT
BLADE LAP MORCELLATOR 15X9.5 (ELECTROSURGICAL) ×3 IMPLANT
CATH FOLEY 3WAY  5CC 16FR (CATHETERS) ×1
CATH FOLEY 3WAY 5CC 16FR (CATHETERS) ×2 IMPLANT
CLOTH BEACON ORANGE TIMEOUT ST (SAFETY) ×3 IMPLANT
CONT PATH 16OZ SNAP LID 3702 (MISCELLANEOUS) ×3 IMPLANT
COVER BACK TABLE 60X90IN (DRAPES) ×6 IMPLANT
COVER TIP SHEARS 8 DVNC (MISCELLANEOUS) ×2 IMPLANT
COVER TIP SHEARS 8MM DA VINCI (MISCELLANEOUS) ×1
DECANTER SPIKE VIAL GLASS SM (MISCELLANEOUS) ×3 IMPLANT
DRAPE WARM FLUID 44X44 (DRAPE) ×3 IMPLANT
DRSG OPSITE POSTOP 3X4 (GAUZE/BANDAGES/DRESSINGS) ×3 IMPLANT
DURAPREP 26ML APPLICATOR (WOUND CARE) ×3 IMPLANT
ELECT REM PT RETURN 9FT ADLT (ELECTROSURGICAL) ×3
ELECTRODE REM PT RTRN 9FT ADLT (ELECTROSURGICAL) ×2 IMPLANT
GAUZE VASELINE 3X9 (GAUZE/BANDAGES/DRESSINGS) IMPLANT
GLOVE BIO SURGEON STRL SZ 6.5 (GLOVE) ×3 IMPLANT
GLOVE BIOGEL PI IND STRL 7.0 (GLOVE) ×6 IMPLANT
GLOVE BIOGEL PI INDICATOR 7.0 (GLOVE) ×3
IV STOPCOCK 4 WAY 40  W/Y SET (IV SOLUTION)
IV STOPCOCK 4 WAY 40 W/Y SET (IV SOLUTION) IMPLANT
KIT ACCESSORY DA VINCI DISP (KITS) ×1
KIT ACCESSORY DVNC DISP (KITS) ×2 IMPLANT
LEGGING LITHOTOMY PAIR STRL (DRAPES) ×3 IMPLANT
LIQUID BAND (GAUZE/BANDAGES/DRESSINGS) ×3 IMPLANT
NEEDLE HYPO 22GX1.5 SAFETY (NEEDLE) ×3 IMPLANT
OCCLUDER COLPOPNEUMO (BALLOONS) ×3 IMPLANT
PACK ROBOT WH (CUSTOM PROCEDURE TRAY) ×3 IMPLANT
PACK ROBOTIC GOWN (GOWN DISPOSABLE) ×3 IMPLANT
PAD POSITIONER PINK NONSTERILE (MISCELLANEOUS) ×3 IMPLANT
PAD PREP 24X48 CUFFED NSTRL (MISCELLANEOUS) ×6 IMPLANT
SET CYSTO W/LG BORE CLAMP LF (SET/KITS/TRAYS/PACK) IMPLANT
SET IRRIG TUBING LAPAROSCOPIC (IRRIGATION / IRRIGATOR) ×6 IMPLANT
SET TRI-LUMEN FLTR TB AIRSEAL (TUBING) ×3 IMPLANT
SUT VIC AB 0 CT1 27 (SUTURE) ×2
SUT VIC AB 0 CT1 27XBRD ANBCTR (SUTURE) ×4 IMPLANT
SUT VIC AB 4-0 PS2 27 (SUTURE) ×6 IMPLANT
SUT VICRYL 0 UR6 27IN ABS (SUTURE) ×6 IMPLANT
SUT VICRYL 4-0 PS2 18IN ABS (SUTURE) IMPLANT
SUT VLOC 180 0 9IN  GS21 (SUTURE) ×2
SUT VLOC 180 0 9IN GS21 (SUTURE) ×4 IMPLANT
SYR 50ML LL SCALE MARK (SYRINGE) ×3 IMPLANT
SYSTEM CONVERTIBLE TROCAR (TROCAR) ×3 IMPLANT
TIP RUMI ORANGE 6.7MMX12CM (TIP) IMPLANT
TIP UTERINE 5.1X6CM LAV DISP (MISCELLANEOUS) IMPLANT
TIP UTERINE 6.7X10CM GRN DISP (MISCELLANEOUS) ×3 IMPLANT
TIP UTERINE 6.7X6CM WHT DISP (MISCELLANEOUS) IMPLANT
TIP UTERINE 6.7X8CM BLUE DISP (MISCELLANEOUS) IMPLANT
TOWEL OR 17X24 6PK STRL BLUE (TOWEL DISPOSABLE) ×9 IMPLANT
TROCAR 12M 150ML BLUNT (TROCAR) IMPLANT
TROCAR DISP BLADELESS 8 DVNC (TROCAR) ×2 IMPLANT
TROCAR DISP BLADELESS 8MM (TROCAR) ×1
TROCAR HASSON GELL 12X100 (TROCAR) ×3 IMPLANT
TROCAR PORT AIRSEAL 5X120 (TROCAR) ×3 IMPLANT
TROCAR XCEL 12X100 BLDLESS (ENDOMECHANICALS) ×3 IMPLANT
WARMER LAPAROSCOPE (MISCELLANEOUS) ×3 IMPLANT
WATER STERILE IRR 1000ML POUR (IV SOLUTION) ×9 IMPLANT

## 2015-05-09 NOTE — Anesthesia Procedure Notes (Signed)
Procedure Name: Intubation Date/Time: 05/09/2015 8:27 AM Performed by: Vernice Jefferson Pre-anesthesia Checklist: Patient identified, Emergency Drugs available, Suction available, Patient being monitored and Timeout performed Patient Re-evaluated:Patient Re-evaluated prior to inductionOxygen Delivery Method: Circle system utilized Preoxygenation: Pre-oxygenation with 100% oxygen Intubation Type: IV induction Ventilation: Mask ventilation without difficulty Laryngoscope Size: Mac and 3 Tube size: 7.0 mm Number of attempts: 1 Airway Equipment and Method: Stylet Placement Confirmation: ETT inserted through vocal cords under direct vision,  positive ETCO2 and breath sounds checked- equal and bilateral Secured at: 21 cm Tube secured with: Tape Dental Injury: Teeth and Oropharynx as per pre-operative assessment

## 2015-05-09 NOTE — Op Note (Addendum)
05/09/2015  12:46 PM  PATIENT:  Summer Santiago  44 y.o. female  PRE-OPERATIVE DIAGNOSIS:  Uterine Fibroids  POST-OPERATIVE DIAGNOSIS:  uterine fibroids  PROCEDURE:  Procedure(s): ROBOTIC ASSISTED TOTAL HYSTERECTOMY With Possible Power Morcellation (N/A) BILATERAL SALPINGECTOMY (Bilateral)  Power morcellation used  SURGEON:  Surgeon(s) and Role:    * Aloha Gell, MD - Primary    * Azucena Fallen, MD - Assisting  PHYSICIAN ASSISTANT:   ASSISTANTS: Mody, MD   ANESTHESIA:   local and general  EBL:  Total I/O In: 1350 [I.V.:1350] Out: 150 [Blood:150]  BLOOD ADMINISTERED:none  DRAINS: Urinary Catheter (Foley)   LOCAL MEDICATIONS USED:  MARCAINE    and OTHER quarter percent Marcaine total of 20 cc used on the abdominal wall incision sites and mixture of 30 cc saline mixed with 30 cc half percent ropivacaine placed into the peritoneal cavity at the end of the procedure  SPECIMEN:  Source of Specimen:  Uterus cervix bilateral fallopian tubes  DISPOSITION OF SPECIMEN:  PATHOLOGY  COUNTS:  YES  TOURNIQUET:  * No tourniquets in log *  DICTATION: .Note written in EPIC  PLAN OF CARE: Admit for overnight observation  PATIENT DISPOSITION:  PACU - hemodynamically stable.   Delay start of Pharmacological VTE agent (>24hrs) due to surgical blood loss or risk of bleeding: yes   Complications: none Antibiotics: 2 g Ancef Findings: nl liver edge, no evidence endometriosis, nl b/l ovaries and tubes, enlarged uterus with single dominant fibroid. Hemostasis post-procedure with peristalsis of bilateral ureters.   Indications: This is a 44 year old G29 with enlarged uterus and symptoms from mass effect. Patient declined alternative treatments and opted for surgical management. Patient understood preoperatively that palmar morcellation was likely to be needed.   Procedure: After informed consent and discussion of alternatives to hysterectomy, the patient was taken to the operating room  where general anesthesia was initiated without difficulty. She was prepped and draped in normal sterile fashion in the dorsal supine lithotomy position. A Foley catheter was inserted sterilely into the bladder. A bimanual examination was done to assess the size and position of the uterus. A weighted speculum was placed in the vagina and deaver retractors were used on the anterior vaginal wall. .The cervix was grasped with tenaculum. The cervix was sounded to 11 cm. The cervix was assessed to identify the Rumi-Co size.  A small cup and an 10 cm shaft was used. The uterine balloon was inflated.  Gloved were changed. Attention was then turned to the patient's abdomen. 0.5 % marcaine was used prior to all incision. A total of 20 cc of marcaine was used.  A 10 mm incision was made in the umbilicus and blunt and sharp dissection was done until the fascia was identified. This was then grasped with Kocher clamps x2 and entered sharply. A pursestring suture of 0 Vicryl was then placed along the incision and a non-bladed Sheryle Hail was inserted into the peritoneal cavity. Intraperitoneal placement was confirmed with the use of the camera and pneumoperitoneum was created to 15 mm of mercury. The pursestring suture was secured around the port and pneumoperitoneum was maintained. Brief survey of the abdomen and pelvis was done with findings as above. The abdominal wall was assessed and additional port sites were marked. 8 mm incisions were placed in the right (two) and left lower quadrants (2) and non-bladed ports were placed under direct visualization. The robot was brought to the patient's side and attached with the right side docking. The robotic instruments were placed  under direct visualization until proper placement just over the uterus.  I then went to the robotic console. Brief survey of the patient's abdomen and pelvis revealed  findings as above.Bilateral tubes and ovaries were normal. No significant adhesions were  noted. I began on the left side: the  left tube was serially cauterized with bipolar cautery with the use of the PK device and transected with the monopolar scissors. The  left utero-ovarian ligament was serially cauterized with the bipolar PK and transected with the monopolar scissors. The left round ligament was cauterized and divided.  The anterior leaf of the broad ligament was dissected bluntly then monopolar cautery was used to separate the anterior and posterior leafs of the broad ligament. This was carried down through to the bladder flap. Attention was then turned to the patient's  Right side: the right tube was cauterized and transected,  the  right uterine ovarian ligament was serially cauterized with the PK and transected with the monopolar scissors. The right  broad ligament was cauterized and transected. The anterior and posterior leaves of the broad ligament were bluntly and sharply dissected apart from each other. Monopolar cautery was used to come across the anterior leaf of the right broad ligament. This dissection was carried down across to the midline to create the bladder flap. The leftt uterine artery was serially cauterized with the PK and transected with the monopolar scissors. The right uterine artery and then serially cauterized with the PK and transected with the monopolar scissors. The uterus was placed on stretch to allow better visualization of the arteries. The bladder flap was further taken down both sharply and with cautery. Cautery was used for hemostasis on several places along the bladder flap. At this point with the pressure inward on the Rumi, the posterior  colpotomy was made with the monopolar scissors. This was carried around to the patient's right side. Additional bipolar cautery was used on the  both angles of the cuff- this was carried out with the PK bipolar. The uterus was then positioned  posteriorly  to allow easy access to the  anterior  colpotomy. This was carried  out with the monopolar scissors.  Good hemostasis was noted along the angles of the incision.  Given the large size of the uterus attempt was made to bivalve the uterus and deliver it vaginally. Using a weighted speculum and Deaver retractors as well as with tenaculum on the right and left cervical wall using both a scalpel and sharp scissors the cervix was bivalved the lower uterus was bivalved. Entry into the endometrial cavity was accomplished. I was unable to bivalve up to the fundus due to the large size of the uterus and the inability to manipulate the uterus through the top of the vagina. About 20 minutes were spent in attempted to bivalve vaginally. At this point and went back to the robotic console and attempted to bivalve the uterus again from the fundal aspect. A large dominant fibroid with calcification was encountered. After an additional 15 minutes I felt that we would be unable to significantly reduce the uterine volume to allow for vaginal delivery. Decision was made to move to palmar morcellation.  Irrigation was used and the vaginal cuff appeared hemostatic. A 9 inch V-lock suture was placed though the umbilical port. Instruments were changed to allow a suture cut needle driver through the #1 port and and a long-tipped forceps through the #2 port. The right angle was closed and locked with the V-lock suture. Running  suture was carried out tothe L angle.  A more superficial layer of suture was placed travelling back to the right angle. The suture was cut and removed through one of the abdominal ports. Excellent hemostasis was noted. Suction irrigation was carried out and hemostasis was assured along the vaginal cuff. The utero-ovarian ligaments were reassessed also found to be hemostatic. The robotic portion was completed. The robot was undocked.   We then moved to power morcellation. I re-scrubbed and donned a new set of sterile gown and gloves. I then went to the patient's side removed the  10 mm umbilical trocar in place the power morcellator and through the umbilical incision. Camera was changed to an 8 mm camera and placed through the right sided port. The uterus was serially morcellated through the umbilicus all incision. Care was made to watch the pallor bladed all times and also to follow any tissue chips but did not make it through the morcellator. These were all removed at the end of the case. Once entire specimen was removed the morcellator was removed from the unlikely incision and the standard trocar was placed.    All free fluid was removed from the abdomen. Suction irrigation was carried out.  By standard laparoscopy the pelvis was irrigated and evaluated.  The patient was placed in reverse Trendelenburg, all additional fluid was suctioned from the abdomen and pelvis the cuff was reinspected and  found to be hemostatic. All pedicles were also found to be hemostatic. Ropivacaine solution was placed into the pelvis. Under direct visualization the ports were removed. Pneumoperitoneum was released and the umbilical port was removed.  The  pursestring suture at the umbilicus was then closed. No additional fascial incisions were closed due to the small size and the nondilated nature of these incisions. A 4-0 Vicryl was used to close the additional laparoscopic ports sites. Dermabond was placed on top of the suture closures.  Good hemostasis was noted.  Sponge lap and needle counts were correct x3 the patient was woken from general anesthesia having tolerated the procedure well and taken to the recovery room in a stable fashion.    Please note: once decision was made for morcellation, the b/l fallopian tubes were transected and pushed out through the vaginal opening so that adenxa would not be morcellated. Once tubes were removed, closure of the cuff was done.

## 2015-05-09 NOTE — Anesthesia Preprocedure Evaluation (Addendum)
Anesthesia Evaluation  Patient identified by MRN, date of birth, ID band Patient awake    Reviewed: Allergy & Precautions, NPO status , Patient's Chart, lab work & pertinent test results  Airway Mallampati: I  TM Distance: >3 FB Neck ROM: Full    Dental no notable dental hx. (+) Teeth Intact   Pulmonary neg pulmonary ROS,  breath sounds clear to auscultation  Pulmonary exam normal       Cardiovascular negative cardio ROS Normal cardiovascular examRhythm:Regular Rate:Normal     Neuro/Psych negative neurological ROS  negative psych ROS   GI/Hepatic negative GI ROS,   Endo/Other  hyperlipidemia  Renal/GU negative Renal ROS  negative genitourinary   Musculoskeletal Roscea   Abdominal Normal abdominal exam  (+)   Peds  Hematology   Anesthesia Other Findings   Reproductive/Obstetrics Uterine fibroids                            Anesthesia Physical Anesthesia Plan  ASA: II  Anesthesia Plan: General   Post-op Pain Management:    Induction: Intravenous  Airway Management Planned: Oral ETT  Additional Equipment:   Intra-op Plan:   Post-operative Plan: Extubation in OR  Informed Consent: I have reviewed the patients History and Physical, chart, labs and discussed the procedure including the risks, benefits and alternatives for the proposed anesthesia with the patient or authorized representative who has indicated his/her understanding and acceptance.   Dental advisory given  Plan Discussed with: Anesthesiologist, CRNA and Surgeon  Anesthesia Plan Comments:         Anesthesia Quick Evaluation

## 2015-05-09 NOTE — Brief Op Note (Signed)
05/09/2015  12:46 PM  PATIENT:  Summer Santiago  44 y.o. female  PRE-OPERATIVE DIAGNOSIS:  Uterine Fibroids  POST-OPERATIVE DIAGNOSIS:  uterine fibroids  PROCEDURE:  Procedure(s): ROBOTIC ASSISTED TOTAL HYSTERECTOMY With Possible Power Morcellation (N/A) BILATERAL SALPINGECTOMY (Bilateral)  Power morcellation used  SURGEON:  Surgeon(s) and Role:    * Aloha Gell, MD - Primary    * Azucena Fallen, MD - Assisting  PHYSICIAN ASSISTANT:   ASSISTANTS: Mody, MD   ANESTHESIA:   local and general  EBL:  Total I/O In: 1350 [I.V.:1350] Out: 150 [Blood:150]  BLOOD ADMINISTERED:none  DRAINS: Urinary Catheter (Foley)   LOCAL MEDICATIONS USED:  MARCAINE    and OTHER quarter percent Marcaine total of 20 cc used on the abdominal wall incision sites and mixture of 30 cc saline mixed with 30 cc half percent ropivacaine placed into the peritoneal cavity at the end of the procedure  SPECIMEN:  Source of Specimen:  Uterus cervix bilateral fallopian tubes  DISPOSITION OF SPECIMEN:  PATHOLOGY  COUNTS:  YES  TOURNIQUET:  * No tourniquets in log *  DICTATION: .Note written in EPIC  PLAN OF CARE: Admit for overnight observation  PATIENT DISPOSITION:  PACU - hemodynamically stable.   Delay start of Pharmacological VTE agent (>24hrs) due to surgical blood loss or risk of bleeding: yes

## 2015-05-09 NOTE — Progress Notes (Signed)
Notified Julianne Handler, CNM that pt was c/o eye irritation and watery eyes since surgery and that pt was requesting some type of drops. CNM states that this is likely due to surgery and no need for drops at this time if eyes are watery. Ordered to continue to monitor for now, no other orders at this time. Marry Guan

## 2015-05-09 NOTE — Transfer of Care (Signed)
Immediate Anesthesia Transfer of Care Note  Patient: Summer Santiago  Procedure(s) Performed: Procedure(s): ROBOTIC ASSISTED TOTAL HYSTERECTOMY With Possible Power Morcellation (N/A) BILATERAL SALPINGECTOMY (Bilateral)  Patient Location: PACU  Anesthesia Type:General  Level of Consciousness: awake, alert  and oriented  Airway & Oxygen Therapy: Patient Spontanous Breathing and Patient connected to nasal cannula oxygen  Post-op Assessment: Report given to RN and Post -op Vital signs reviewed and stable  Post vital signs: Reviewed and stable  Last Vitals:  Filed Vitals:   05/09/15 1230  BP:   Pulse:   Temp: 37 C  Resp:     Complications: No apparent anesthesia complications

## 2015-05-09 NOTE — Anesthesia Postprocedure Evaluation (Signed)
Anesthesia Post Note  Patient: Summer Santiago  Procedure(s) Performed: Procedure(s) (LRB): ROBOTIC ASSISTED TOTAL HYSTERECTOMY With Possible Power Morcellation (N/A) BILATERAL SALPINGECTOMY (Bilateral)  Anesthesia type: General  Patient location: PACU  Post pain: Pain level controlled  Post assessment: Post-op Vital signs reviewed  Last Vitals:  Filed Vitals:   05/09/15 1445  BP: 129/71  Pulse: 60  Temp:   Resp: 19    Post vital signs: Reviewed  Level of consciousness: sedated  Complications: No apparent anesthesia complications

## 2015-05-10 DIAGNOSIS — D259 Leiomyoma of uterus, unspecified: Secondary | ICD-10-CM | POA: Diagnosis not present

## 2015-05-10 LAB — CBC
HCT: 35.9 % — ABNORMAL LOW (ref 36.0–46.0)
HEMOGLOBIN: 11.8 g/dL — AB (ref 12.0–15.0)
MCH: 31.1 pg (ref 26.0–34.0)
MCHC: 32.9 g/dL (ref 30.0–36.0)
MCV: 94.5 fL (ref 78.0–100.0)
Platelets: 144 10*3/uL — ABNORMAL LOW (ref 150–400)
RBC: 3.8 MIL/uL — ABNORMAL LOW (ref 3.87–5.11)
RDW: 14.6 % (ref 11.5–15.5)
WBC: 8.5 10*3/uL (ref 4.0–10.5)

## 2015-05-10 MED ORDER — OXYCODONE-ACETAMINOPHEN 5-325 MG PO TABS: 1.0000 | ORAL_TABLET | ORAL | Status: DC | PRN

## 2015-05-10 NOTE — Discharge Summary (Signed)
Summer Santiago MRN: [id number] DOB/AGE: [birth year] 43 y.o.  Admit date: 05/09/2015 Discharge date: 05/10/15   Admission Diagnoses: Uterine Fibroids  Discharge Diagnoses: Uterine Fibroids        Active Problems:   Fibroids   Discharged Condition: stable  Hospital Course: admitted for uncomplicated robotic assisted TLH with morcellation. Uncomplicated post-op course.   On post-op d#1 pt notes no CP, no SOB, pain controlled w/ po meds. tol po, walked in hallways, no N/V. Minimal vaginal spotting.  Exam: . Filed Vitals:   05/09/15 1649 05/09/15 2227 05/10/15 0212 05/10/15 0556  BP: 129/69 112/68 113/60 114/71  Pulse: 62 49 43 57  Temp: 99.8 F (37.7 C) 99 F (37.2 C) 98.2 F (36.8 C) 98.2 F (36.8 C)  TempSrc: Axillary Oral Oral Oral  Resp: 12 16 16 18   Height:      Weight:      SpO2: 97% 100% 99% 99%   CV: RRR Pulm: CTAB Abd: soft, ND, approp tended, l'scope incisions healeing well, approximated with dermabond LE: SCDs in place, ND Gu: minimal staining on pad Gen: no distress  CBC    Component Value Date/Time   WBC 8.5 05/10/2015 0510   RBC 3.80* 05/10/2015 0510   HGB 11.8* 05/10/2015 0510   HCT 35.9* 05/10/2015 0510   PLT 144* 05/10/2015 0510   MCV 94.5 05/10/2015 0510   MCH 31.1 05/10/2015 0510   MCHC 32.9 05/10/2015 0510   RDW 14.6 05/10/2015 0510      Consults: None  Treatments: surgery: TLH w/ morcellation  Disposition: Final discharge disposition not confirmed     Medication List    TAKE these medications        ezetimibe 10 MG tablet  Commonly known as:  ZETIA  Take 1 tablet (10 mg total) by mouth daily.     levothyroxine 88 MCG tablet  Commonly known as:  SYNTHROID, LEVOTHROID  Take 1 tablet (88 mcg total) by mouth daily before breakfast.     metroNIDAZOLE 0.75 % cream  Commonly known as:  METROCREAM  Apply 1 application topically daily as needed (For rosacea.).     naproxen sodium 220 MG tablet  Commonly known as:  ANAPROX  Take 220 mg  by mouth daily as needed (For headache.).     oxyCODONE-acetaminophen 5-325 MG per tablet  Commonly known as:  PERCOCET/ROXICET  Take 1-2 tablets by mouth every 4 (four) hours as needed for severe pain (moderate to severe pain (when tolerating fluids)).     zolpidem 10 MG tablet  Commonly known as:  AMBIEN  Take 10 mg by mouth at bedtime as needed for sleep.         Signed: Ala Dach., MD 05/10/2015, 9:36 AM

## 2015-05-10 NOTE — Progress Notes (Signed)
Foley discontinued. Pt voided 350 after 300 input. Discharge teaching complete. Pt understood all instructions and did not have any questions. Pt pushed via wheelchair and discharged home to family.

## 2015-05-10 NOTE — Addendum Note (Signed)
Addendum  created 05/10/15 0836 by Hewitt Blade, CRNA   Modules edited: Notes Section   Notes Section:  File: [id number]

## 2015-05-10 NOTE — Anesthesia Postprocedure Evaluation (Signed)
  Anesthesia Post-op Note  Patient: Summer Santiago  Procedure(s) Performed: Procedure(s): ROBOTIC ASSISTED TOTAL HYSTERECTOMY With Possible Architect (N/A) BILATERAL SALPINGECTOMY (Bilateral)  Patient Location: PACU and Women's Unit  Anesthesia Type:General  Level of Consciousness: awake, alert  and oriented  Airway and Oxygen Therapy: Patient Spontanous Breathing  Post-op Pain: none  Post-op Assessment: Post-op Vital signs reviewed and Patient's Cardiovascular Status Stable  Post-op Vital Signs: Reviewed  Last Vitals:  Filed Vitals:   05/10/15 0556  BP: 114/71  Pulse: 57  Temp: 36.8 C  Resp: 18    Complications: No apparent anesthesia complications

## 2015-05-12 ENCOUNTER — Encounter (HOSPITAL_COMMUNITY): Payer: Self-pay | Admitting: Obstetrics

## 2015-08-25 ENCOUNTER — Other Ambulatory Visit: Payer: Self-pay | Admitting: *Deleted

## 2015-08-25 MED ORDER — ZOLPIDEM TARTRATE 10 MG PO TABS: 10.0000 mg | ORAL_TABLET | Freq: Every evening | ORAL | Status: DC | PRN

## 2015-10-28 ENCOUNTER — Other Ambulatory Visit: Payer: Self-pay | Admitting: *Deleted

## 2015-10-28 MED ORDER — ZOLPIDEM TARTRATE 10 MG PO TABS: 10.0000 mg | ORAL_TABLET | Freq: Every evening | ORAL | Status: DC | PRN

## 2015-11-13 ENCOUNTER — Ambulatory Visit (INDEPENDENT_AMBULATORY_CARE_PROVIDER_SITE_OTHER): Payer: 59 | Admitting: Sports Medicine

## 2015-11-13 VITALS — BP 143/94 | HR 74 | Temp 98.3°F | Resp 18 | Wt 176.7 lb

## 2015-11-13 DIAGNOSIS — M25511 Pain in right shoulder: Secondary | ICD-10-CM | POA: Diagnosis not present

## 2015-11-13 MED ORDER — MELOXICAM 15 MG PO TABS: ORAL_TABLET | ORAL | Status: DC

## 2015-11-13 NOTE — Progress Notes (Signed)
   Subjective:    I'm seeing this patient as a consultation for: Dr. Iran Planas  CC: "arm hurts"  HPI: Patient presents with a 2 month history of right shoulder pain that became acutely worse last night after falling. She says that it feels like the same pain that she has had over the past 2 months, just significantly worse. She localizes the pain over her anterior shoulder with radiation to her mid upper arm along the anterior aspect. Pain is limiting her ROM but she believes she has full ROM and denies weakness or sensory changes. No pain in her neck or below the mid upper arm. Denies fevers.  Past medical history, Surgical history, Family history not pertinant except as noted below, Social history, Allergies, and medications have been entered into the medical record, reviewed, and no changes needed.   Review of Systems: No headache, visual changes, nausea, vomiting, diarrhea, constipation, dizziness, abdominal pain, skin rash, fevers, chills, night sweats, weight loss, swollen lymph nodes, body aches, joint swelling, muscle aches, chest pain, shortness of breath, mood changes, visual or auditory hallucinations.   Objective:   General: Well Developed, well nourished, and in no acute distress.  Neuro/Psych: Alert and oriented x3, extra-ocular muscles intact, able to move all 4 extremities, sensation grossly intact. Skin: Warm and dry, no rashes noted.  Respiratory: Not using accessory muscles, speaking in full sentences, trachea midline.  Cardiovascular: Pulses palpable, no extremity edema. Abdomen: Does not appear distended. Right Shoulder: Inspection reveals no abnormalities, atrophy or asymmetry. Palpation is normal with no tenderness over AC joint or bicipital groove. ROM is full in all planes. Rotator cuff strength hard to assess due to pain limitation. Signs of impingement with positive Neer and Hawkin's tests, positive empty can sign. Speeds test positive for pain and Yergason's  test normal. Positive Obrien's, negative clunk and good stability. Normal scapular function observed. No painful arc and no drop arm sign. No apprehension sign  Procedure: Real-time Ultrasound Guided Injection of right glenohumeral joint Device: GE Logiq E  Verbal informed consent obtained.  Time-out conducted.  Noted no overlying erythema, induration, or other signs of local infection.  Skin prepped in a sterile fashion.  Local anesthesia: Topical Ethyl chloride.  With sterile technique and under real time ultrasound guidance:  Spinal needle advanced into joint, 1 mL kenalog 40, 2 mL lidocaine, 2 mL Marcaine injected easily. Completed without difficulty  Pain immediately resolved suggesting accurate placement of the medication.  Advised to call if fevers/chills, erythema, induration, drainage, or persistent bleeding.  Images permanently stored and available for review in the ultrasound unit.  Impression: Technically successful ultrasound guided injection.  Impression and Recommendations:   This case required medical decision making of moderate complexity.  Patient has multiple postiive physical exam findings that may indicate both glenohumeral joint and rotator cuff pathology. Will do a diagnostic and therapeutic glenohumeral joint injection today. Will Rx Meloxicam, PT, and X-rays. Patient to return in 1 month for reassessment.  Update: glenohumeral injection was successful in relieving almost all of her pain. She still had pain with flexion of the shoulder joint.

## 2015-11-13 NOTE — Assessment & Plan Note (Signed)
Multifactorial pain including bursitis, possible labral injury, as well as bicipital tendinitis. Per patient request we performed an interventional injection to the glenohumeral joint today. X-rays, a single session of formal physical therapy, meloxicam. Return in one month.

## 2015-11-14 ENCOUNTER — Ambulatory Visit: Payer: 59 | Admitting: Sports Medicine

## 2015-11-17 ENCOUNTER — Ambulatory Visit: Payer: 59 | Admitting: Sports Medicine

## 2015-11-26 ENCOUNTER — Encounter: Payer: Self-pay | Admitting: Rehabilitative and Restorative Service Providers"

## 2015-11-26 ENCOUNTER — Ambulatory Visit (INDEPENDENT_AMBULATORY_CARE_PROVIDER_SITE_OTHER): Payer: 59 | Admitting: Rehabilitative and Restorative Service Providers"

## 2015-11-26 DIAGNOSIS — M623 Immobility syndrome (paraplegic): Secondary | ICD-10-CM

## 2015-11-26 DIAGNOSIS — M25511 Pain in right shoulder: Secondary | ICD-10-CM

## 2015-11-26 DIAGNOSIS — R293 Abnormal posture: Secondary | ICD-10-CM | POA: Diagnosis not present

## 2015-11-26 DIAGNOSIS — Z7409 Other reduced mobility: Secondary | ICD-10-CM

## 2015-11-26 DIAGNOSIS — M2560 Stiffness of unspecified joint, not elsewhere classified: Secondary | ICD-10-CM

## 2015-11-26 NOTE — Patient Instructions (Addendum)
Shoulder Blade Squeeze    Rotate shoulders back, then squeeze shoulder blades down and back. Hold 10 sec Repeat _10___ times. Do _several ___ sessions per day.  Scapula Adduction With Pectoralis Stretch: Low - Standing   Shoulders at 45 hands even with shoulders, keeping weight through legs, shift weight forward until you feel pull or stretch through the front of your chest. Hold _30__ seconds. Do _3__ times, _2-4__ times per day.   Scapula Adduction With Pectoralis Stretch: Mid-Range - Standing   Shoulders at 90 elbows even with shoulders, keeping weight through legs, shift weight forward until you feel pull or strength through the front of your chest. Hold __30_ seconds. Do _3__ times, __2-4_ times per day.   Scapula Adduction With Pectoralis Stretch: High - Standing   Shoulders at 120 hands up high on the doorway, keeping weight on feet, shift weight forward until you feel pull or stretch through the front of your chest. Hold _30__ seconds. Do _3__ times, _2-3__ times per day.  Resisted External Rotation: in Neutral - Bilateral   PALMS UP Sit or stand, tubing in both hands, elbows at sides, bent to 90, forearms forward. Pinch shoulder blades together and rotate forearms out. Keep elbows at sides. Repeat __10__ times per set. Do _2-3___ sets per session. Do _2-3___ sessions per day.   Low Row: Standing   Face anchor, feet shoulder width apart. Palms up, pull arms back, squeezing shoulder blades together. Repeat 10__ times per set. Do 2-3__ sets per session. Do 2-3__ sessions per week. Anchor Height: Waist   Strengthening: Resisted Extension   Hold tubing in right hand, arm forward. Pull arm back, elbow straight. Repeat _10___ times per set. Do 2-3____ sets per session. Do 2-3____ sessions per day.  Arm: External Rotation    Stand facing tubing at elbow height, elbow at side, forearm across body. Pull forearm away from body as far as possible, keeping elbow  at side. Move slowly and deliberately. Repeat __10__ times. Do 1-3____ sessions per day. 1-2 x/day     Neurovascular: Median Nerve Stretch - Supine    Lie with neck supported, side-bent away from moving arm. Hold right arm out to side, elbow bent, thumb down, fingers and wrist bent back. Slowly straighten elbowas far as possible without pain. Hold for __60__ seconds. Repeat __1-2__ times per set. Do __1-2__ sets per day,

## 2015-11-26 NOTE — Therapy (Addendum)
[facility] [place], [place], [address] Phone: [phone]   Fax:  [phone]  Physical Therapy Evaluation  Patient Details  Name: Summer Santiago MRN: [id number] Date of Birth: 07/02/1971 Referring Provider: T  Encounter Date: 11/26/2015      PT End of Session - 11/26/15 1016    Visit Number 1   Number of Visits 4   Date for PT Re-Evaluation 01/07/16   PT Start Time 0911   PT Stop Time 1002   PT Time Calculation (min) 51 min   Activity Tolerance Patient tolerated treatment well      Past Medical History  Diagnosis Date  . Hyperlipidemia   . Thyroid disease   . Hypothyroidism     Past Surgical History  Procedure Laterality Date  . Appendectomy    . Breast reduction surgery    . Anterior cruciate ligament repair      right  . Robotic assisted total hysterectomy N/A 05/09/2015    Procedure: ROBOTIC ASSISTED TOTAL HYSTERECTOMY With Possible Power Morcellation;  Surgeon: Aloha Gell, MD;  Location: [facility];  Service: Gynecology;  Laterality: N/A;  . Bilateral salpingectomy Bilateral 05/09/2015    Procedure: BILATERAL SALPINGECTOMY;  Surgeon: Aloha Gell, MD;  Location: [facility];  Service: Gynecology;  Laterality: Bilateral;    There were no vitals filed for this visit.  Visit Diagnosis:  Shoulder pain, acute, right - Plan: PT plan of care cert/re-cert  Stiffness due to immobility - Plan: PT plan of care cert/re-cert  Abnormal posture - Plan: PT plan of care cert/re-cert      Subjective Assessment - 11/26/15 0909    Subjective Patient reports onset of stiffness in the Rt shoulder over the past 3 months and she fell 11/12/15 with incresaed symptoms following fall. Seen my MD 11/13/15 and received injection with good improvement. However symptoms continue including pain; lmited movement and decreased functional activity level. Arm feels stiff and achy expecially with driving.    Pertinent History Lt  shoudler pain a couple of years ago resolved with massage therapy; torn Rt ACL 3 yerars ago   How long can you sit comfortably? no limit   How long can you stand comfortably? no limit   How long can you walk comfortably? no limit   Diagnostic tests xrays    Patient Stated Goals prevent recurrence of problems; HEP    Currently in Pain? No/denies            [facility] PT Assessment - 11/26/15 0001    Assessment   Medical Diagnosis Rt shoulder pain    Referring Provider T   Onset Date/Surgical Date 08/21/15   Hand Dominance Right   Next MD Visit 1/17   Precautions   Precautions None   Balance Screen   Has the patient fallen in the past 6 months Yes   How many times? 1   Has the patient had a decrease in activity level because of a fear of falling?  No   Is the patient reluctant to leave their home because of a fear of falling?  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/ forward over laptop    Leisure laundry was working out until 3-6 months ago now sedentary    Observation/Other Assessments   Focus on Therapeutic Outcomes (FOTO)  21% limitation   Sensation   Additional Comments WFL's per pt report   Posture/Postural Control   Posture Comments head forward; shoulders rounded and elevated; head of the humerus anterior iin orientatioin; scapulae abd and rotated along the thoracic wall    AROM   Right Shoulder Extension 85 Degrees   Right Shoulder Flexion 180 Degrees   Right Shoulder ABduction 155 Degrees   Right Shoulder Internal Rotation 75 Degrees   Right Shoulder External Rotation 90 Degrees   Left Shoulder Extension 85 Degrees   Left Shoulder Flexion 175 Degrees   Left Shoulder ABduction 180 Degrees   Left Shoulder Internal Rotation 75 Degrees   Left Shoulder External Rotation 113 Degrees    Strength   Overall Strength Comments 5/5 bilat shoulders    Palpation   Palpation comment tenderness and tightness Rt anterior shoulder                    [facility] Adult PT Treatment/Exercise - 11/26/15 0001    Neuro Re-ed    Neuro Re-ed Details  postural education engaging posterior shoulder girdle musculature    Shoulder Exercises: Supine   Other Supine Exercises neural mobilization medial nerve 60 sec x 2 reps each UE   Shoulder Exercises: Standing   External Rotation Strengthening;Right;10 reps;Theraband   Theraband Level (Shoulder External Rotation) Level 2 (Red)   Extension Strengthening;Both;10 reps   Theraband Level (Shoulder Extension) Level 2 (Red)   Row Strengthening;Both;10 reps;Theraband   Theraband Level (Shoulder Row) Level 2 (Red)   Retraction Strengthening;Both;20 reps;Theraband   Theraband Level (Shoulder Retraction) Level 1 (Yellow)   Other Standing Exercises scap squeeze 10 sec hold x 10 reps    Shoulder Exercises: Therapy Ball   Other Therapy Ball Exercises myofacial ball release work    Shoulder Exercises: IT sales professional Limitations 3 way doorway stretch 30 sec x 3 each position   Other Shoulder Stretches supine snow angel with swim noodle 1-2 min                 PT Education - 11/26/15 1002    Education provided Yes   Education Details posture/alignment; nerve stretch; HEP   Person(s) Educated Patient   Methods Explanation;Demonstration;Tactile cues;Verbal cues;Handout   Comprehension Verbalized understanding;Returned demonstration;Verbal cues required;Tactile cues required             PT Long Term Goals - 11/26/15 1022    PT LONG TERM GOAL #1   Title I in initial HEP 11/26/15   Time 1   Period Days   Status Achieved   PT LONG TERM GOAL #2   Title Improve posture and alignment with patient to demonstrate good upright posture engaging posterior shoulder girdle musculature 01/07/16   Time 6   Period Weeks   Status New    PT LONG TERM GOAL #3   Title Progress exercise program to include higher level exercises as indicated 01/07/16   Time 6   Period Weeks   Status New   PT LONG TERM GOAL #4   Title Maintain FOTO at or below 21% limitaion 01/07/16   Time 6   Period Weeks   Status New               Plan - 11/26/15 1017    Clinical Impression Statement Patient presents with Rt shoulder pain and limitations of ~3 month duration with significant increase in symptoms following a fall 11/12/15. she received an injection 11/13/15 with good improvement in  symptoms. She continues to have end range limitations; pain with functional activities; poop posture and alignment; limited ADL's. Patient will benefit form PT to address limitaions as noted.    Pt will benefit from skilled therapeutic intervention in order to improve on the following deficits Postural dysfunction;Improper body mechanics;Decreased range of motion;Decreased activity tolerance;Decreased endurance   Rehab Potential Good   PT Frequency Other (comment)  every 2 weeks as needed    PT Duration 6 weeks   PT Treatment/Interventions Patient/family education;ADLs/Self Care Home Management;Neuromuscular re-education;Therapeutic exercise;Therapeutic activities;Manual techniques;Dry needling;Cryotherapy;Electrical Stimulation;Moist Heat;Ultrasound   PT Next Visit Plan progress with postural correction; stretching; strengthening. Patient travels with her work and will call to schedule additional appointments as needed.   PT Home Exercise Plan myofacial  ball release work; HEP; postural correction    Consulted and Agree with Plan of Care Patient         Problem List Patient Active Problem List   Diagnosis Date Noted  . Right shoulder pain 11/13/2015  . Fibroids 05/09/2015  . Uterine fibroid 03/03/2015  . Pelvic mass in female 02/14/2015  . Rosacea 11/13/2014  . Unspecified vitamin D deficiency 12/27/2013  . Hypothyroidism 11/26/2013  .  Hyperlipidemia 11/26/2013  . Insomnia 11/26/2013    Calea Hribar Nilda Simmer PT, MPH  11/26/2015, 12:38 PM  [facility] [place], [place], [address] Phone: [phone]   Fax:  [phone]  Name: JANEIL SCHEXNAYDER MRN: [id number] Date of Birth: 23-Apr-1971   PHYSICAL THERAPY DISCHARGE SUMMARY  Visits from Start of Care: Eval only  Current functional level related to goals / functional outcomes: unchanged   Remaining deficits: unchanged   Education / Equipment: HEP/theraband Plan: Patient agrees to discharge.  Patient goals were partially met. Patient is being discharged due to not returning since the last visit.  ?????    Brecken Dewoody P. Helene Kelp PT, MPH 12/19/2015 12:09 PM

## 2015-12-18 ENCOUNTER — Other Ambulatory Visit: Payer: Self-pay

## 2015-12-22 ENCOUNTER — Ambulatory Visit: Payer: 59 | Admitting: Sports Medicine

## 2015-12-24 ENCOUNTER — Other Ambulatory Visit: Payer: Self-pay | Admitting: *Deleted

## 2015-12-24 MED ORDER — EZETIMIBE 10 MG PO TABS: 10.0000 mg | ORAL_TABLET | Freq: Every day | ORAL | Status: DC

## 2015-12-24 MED ORDER — ZOLPIDEM TARTRATE 10 MG PO TABS: 10.0000 mg | ORAL_TABLET | Freq: Every evening | ORAL | Status: DC | PRN

## 2015-12-24 MED ORDER — LEVOTHYROXINE SODIUM 88 MCG PO TABS: 88.0000 ug | ORAL_TABLET | Freq: Every day | ORAL | Status: DC

## 2015-12-30 ENCOUNTER — Other Ambulatory Visit: Payer: Self-pay | Admitting: Physician Assistant

## 2016-03-01 ENCOUNTER — Ambulatory Visit: Payer: 59 | Admitting: Physician Assistant

## 2016-03-01 ENCOUNTER — Other Ambulatory Visit: Payer: Self-pay | Admitting: Physician Assistant

## 2016-03-02 ENCOUNTER — Encounter: Payer: Self-pay | Admitting: Physician Assistant

## 2016-03-02 ENCOUNTER — Ambulatory Visit (INDEPENDENT_AMBULATORY_CARE_PROVIDER_SITE_OTHER): Payer: 59 | Admitting: Physician Assistant

## 2016-03-02 VITALS — BP 134/86 | HR 69 | Ht 67.0 in | Wt 169.0 lb

## 2016-03-02 DIAGNOSIS — R030 Elevated blood-pressure reading, without diagnosis of hypertension: Secondary | ICD-10-CM

## 2016-03-02 DIAGNOSIS — M25511 Pain in right shoulder: Secondary | ICD-10-CM | POA: Diagnosis not present

## 2016-03-02 DIAGNOSIS — E039 Hypothyroidism, unspecified: Secondary | ICD-10-CM

## 2016-03-02 DIAGNOSIS — L409 Psoriasis, unspecified: Secondary | ICD-10-CM | POA: Insufficient documentation

## 2016-03-02 DIAGNOSIS — E785 Hyperlipidemia, unspecified: Secondary | ICD-10-CM

## 2016-03-02 DIAGNOSIS — IMO0001 Reserved for inherently not codable concepts without codable children: Secondary | ICD-10-CM | POA: Insufficient documentation

## 2016-03-02 DIAGNOSIS — Z79899 Other long term (current) drug therapy: Secondary | ICD-10-CM | POA: Diagnosis not present

## 2016-03-02 LAB — COMPLETE METABOLIC PANEL WITH GFR
ALBUMIN: 4.2 g/dL (ref 3.6–5.1)
ALK PHOS: 73 U/L (ref 33–115)
ALT: 26 U/L (ref 6–29)
AST: 26 U/L (ref 10–30)
BUN: 10 mg/dL (ref 7–25)
CALCIUM: 9.4 mg/dL (ref 8.6–10.2)
CO2: 24 mmol/L (ref 20–31)
Chloride: 100 mmol/L (ref 98–110)
Creat: 0.83 mg/dL (ref 0.50–1.10)
GFR, Est African American: >89 mL/min (ref >=60)
GFR, Est Non African American: 87 mL/min (ref >=60)
GLUCOSE: 87 mg/dL (ref 65–99)
POTASSIUM: 4.3 mmol/L (ref 3.5–5.3)
SODIUM: 137 mmol/L (ref 135–146)
Total Bilirubin: 0.5 mg/dL (ref 0.2–1.2)
Total Protein: 7.3 g/dL (ref 6.1–8.1)

## 2016-03-02 LAB — T4, FREE: Free T4: 1.4 ng/dL (ref 0.8–1.8)

## 2016-03-02 LAB — LIPID PANEL
CHOL/HDL RATIO: 3.3 ratio (ref <=5.0)
CHOLESTEROL: 249 mg/dL — AB (ref 125–200)
HDL: 76 mg/dL (ref >=46)
LDL Cholesterol: 143 mg/dL — ABNORMAL HIGH (ref <130)
Triglycerides: 150 mg/dL — ABNORMAL HIGH (ref <150)
VLDL: 30 mg/dL (ref <30)

## 2016-03-02 LAB — TSH: TSH: 4.02 mIU/L

## 2016-03-02 MED ORDER — PREDNISONE 50 MG PO TABS: ORAL_TABLET | ORAL | Status: DC

## 2016-03-02 MED ORDER — ZOLPIDEM TARTRATE 10 MG PO TABS: 10.0000 mg | ORAL_TABLET | Freq: Every evening | ORAL | Status: DC | PRN

## 2016-03-02 MED ORDER — DICLOFENAC EPOLAMINE 1.3 % TD PTCH: 1.0000 | MEDICATED_PATCH | Freq: Two times a day (BID) | TRANSDERMAL | Status: DC

## 2016-03-02 MED ORDER — METRONIDAZOLE 0.75 % EX CREA: 1.0000 "application " | TOPICAL_CREAM | Freq: Every day | CUTANEOUS | Status: DC | PRN

## 2016-03-02 NOTE — Progress Notes (Signed)
   Subjective:    Patient ID: Summer Santiago, female    DOB: 1971/03/30, 45 y.o.   MRN: [id number]  HPI  Pt is a 18 female who presents to the clinic for medication refill and follow up.   Hypothyroidism- on synthyroid daily. No problems or concerns.   Insomnia- uses ambien nightly for sleep. Needs refill.   Hyperlipidemia- on zetia. Needs refill.   New dx of psorasis. Pt sees dermatology. Using some topical steriods with good benefit.   She does continue to have right shoulder pain going on 4 months. She saw Dr. Darene Lamer at end of last year. She went to 1 PT appt. Never got xray of shoulder. He also gave her an injection that last about 1 month. She used mobic as needed which has helped some. She never followed up due to busy schedule. She is still struggling with a lot of pain. She can barely cross her arms. She admits she is right hand dominant and does everything with her right. She wears a travel bag approximately 15lbs on right shoulder daily.    Review of Systems See HPI.    Objective:   Physical Exam  Constitutional: She is oriented to person, place, and time. She appears well-developed and well-nourished.  HENT:  Head: Normocephalic and atraumatic.  Neck: Normal range of motion. Neck supple. No thyromegaly present.  Cardiovascular: Normal rate, regular rhythm and normal heart sounds.   Pulmonary/Chest: Effort normal and breath sounds normal.  Musculoskeletal:  Tenderness over insertion of bicep tendon to the glenoid joint.  Pain with external and internal ROM.  Positive hawkins. Strength 5/5 of right upper extremity.  Hand grip 5/5 rt.  ROM of right shoulder normal full and little pain. Numbness down radial nerve in complete abduction.    Neurological: She is alert and oriented to person, place, and time.  Psychiatric: She has a normal mood and affect. Her behavior is normal.          Assessment & Plan:  Elevated BP= rechecked and looks better. Discussed watching.  Encouraged low salt diet and weight loss.   Hyperlipidemia- will recheck today.   Hypothyroidism- will recheck and adjust accordingly today.   Insomnia- refilled ambien.   psoriasis managed by dermatology.   Right shoulder pain- seems like bicipital tendoniitis with possible labrum tear and some impingment. will schedule MRI. May need xray before MRI. Continue exercises learned in PT. Discussed heat then exercise then ice then biofreeze. Prednisone burst given. flector patch to try over area. Follow up with Dr. Darene Lamer or Summer Santiago.

## 2016-03-03 ENCOUNTER — Other Ambulatory Visit: Payer: Self-pay | Admitting: *Deleted

## 2016-03-03 MED ORDER — LEVOTHYROXINE SODIUM 88 MCG PO TABS: 88.0000 ug | ORAL_TABLET | Freq: Every day | ORAL | Status: DC

## 2016-03-10 ENCOUNTER — Telehealth: Payer: Self-pay | Admitting: Physician Assistant

## 2016-03-10 DIAGNOSIS — M25511 Pain in right shoulder: Secondary | ICD-10-CM

## 2016-03-10 NOTE — Telephone Encounter (Signed)
LM for patient discussing MRI results of shoulder: gave pt's option for ortho referral or another injection with sports med and full round of PT.   She does have a partial thickness tearing of distal tendon and subacrominal edema with mild arthrosis of AC joint.

## 2016-04-07 ENCOUNTER — Other Ambulatory Visit: Payer: Self-pay | Admitting: Sports Medicine

## 2016-06-30 ENCOUNTER — Other Ambulatory Visit: Payer: Self-pay | Admitting: Sports Medicine

## 2016-07-30 ENCOUNTER — Telehealth: Payer: Self-pay | Admitting: Physician Assistant

## 2016-07-30 DIAGNOSIS — E785 Hyperlipidemia, unspecified: Secondary | ICD-10-CM

## 2016-07-30 MED ORDER — PITAVASTATIN CALCIUM 2 MG PO TABS: 2.0000 mg | ORAL_TABLET | Freq: Every day | ORAL | 1 refills | Status: DC

## 2016-07-30 MED ORDER — PITAVASTATIN CALCIUM 2 MG PO TABS: 2.0000 mg | ORAL_TABLET | Freq: Every day | ORAL | 0 refills | Status: DC

## 2016-07-30 NOTE — Telephone Encounter (Signed)
Patient called clinic today stating she has called multiple times and left multiple messages trying to see what she needs to do for her cholesterol medication. Pt states she was given samples for Livalo but they only lasted about a month and hasn't been on anything since then. Pt is OK to continue that Rx. Ask for a [facility] local [facility] and then if it is going to be long term a 90day be sent to mail order. Also, Pt questions when she should have her levels rechecked. She has not been on any Rx for the last month or so, so she wants to know how long she needs to take it before it would show a change in her levels. Will route for review.

## 2016-07-30 NOTE — Telephone Encounter (Signed)
Pt's husband advised of new Rx and when to recheck labs. Verbalized understanding.

## 2016-07-30 NOTE — Telephone Encounter (Signed)
Not sure what happened with leaving voice msg. I have not seen anything. Yes we gave samples to make sure you could tolerate since you have not tolerated statins in the past. Please send livalo 2g daily #30 RF 6. Check lipid level when been on for 3-4 months.

## 2016-08-02 ENCOUNTER — Telehealth: Payer: Self-pay | Admitting: *Deleted

## 2016-08-02 NOTE — Telephone Encounter (Signed)
PA initiated over the phone( could not authenticate patient in covermymeds.) approved through 08/02/2017. Patient and pharm notified   PA # [phone]

## 2016-08-12 ENCOUNTER — Other Ambulatory Visit: Payer: Self-pay | Admitting: *Deleted

## 2016-08-12 MED ORDER — ZOLPIDEM TARTRATE 10 MG PO TABS: 10.0000 mg | ORAL_TABLET | Freq: Every evening | ORAL | 1 refills | Status: DC | PRN

## 2016-12-09 ENCOUNTER — Other Ambulatory Visit: Payer: Self-pay | Admitting: Physician Assistant

## 2016-12-12 ENCOUNTER — Other Ambulatory Visit: Payer: Self-pay | Admitting: Physician Assistant

## 2016-12-12 DIAGNOSIS — E785 Hyperlipidemia, unspecified: Secondary | ICD-10-CM

## 2017-02-09 ENCOUNTER — Ambulatory Visit (INDEPENDENT_AMBULATORY_CARE_PROVIDER_SITE_OTHER): Payer: 59 | Admitting: Physician Assistant

## 2017-02-09 ENCOUNTER — Encounter: Payer: Self-pay | Admitting: Physician Assistant

## 2017-02-09 VITALS — BP 135/85 | HR 74 | Ht 67.0 in | Wt 176.0 lb

## 2017-02-09 DIAGNOSIS — E559 Vitamin D deficiency, unspecified: Secondary | ICD-10-CM | POA: Diagnosis not present

## 2017-02-09 DIAGNOSIS — E039 Hypothyroidism, unspecified: Secondary | ICD-10-CM

## 2017-02-09 DIAGNOSIS — E7800 Pure hypercholesterolemia, unspecified: Secondary | ICD-10-CM | POA: Diagnosis not present

## 2017-02-09 DIAGNOSIS — Z79899 Other long term (current) drug therapy: Secondary | ICD-10-CM | POA: Diagnosis not present

## 2017-02-09 DIAGNOSIS — Z1231 Encounter for screening mammogram for malignant neoplasm of breast: Secondary | ICD-10-CM

## 2017-02-09 LAB — T4, FREE: Free T4: 1.3 ng/dL (ref 0.8–1.8)

## 2017-02-09 LAB — COMPLETE METABOLIC PANEL WITH GFR
ALT: 36 U/L — AB (ref 6–29)
AST: 28 U/L (ref 10–30)
Albumin: 4.2 g/dL (ref 3.6–5.1)
Alkaline Phosphatase: 77 U/L (ref 33–115)
BILIRUBIN TOTAL: 0.6 mg/dL (ref 0.2–1.2)
BUN: 11 mg/dL (ref 7–25)
CO2: 21 mmol/L (ref 20–31)
Calcium: 9.3 mg/dL (ref 8.6–10.2)
Chloride: 101 mmol/L (ref 98–110)
Creat: 0.91 mg/dL (ref 0.50–1.10)
GFR, EST AFRICAN AMERICAN: 89 mL/min (ref >=60)
GFR, Est Non African American: 77 mL/min (ref >=60)
Glucose, Bld: 94 mg/dL (ref 65–99)
Potassium: 4.3 mmol/L (ref 3.5–5.3)
Sodium: 138 mmol/L (ref 135–146)
TOTAL PROTEIN: 6.9 g/dL (ref 6.1–8.1)

## 2017-02-09 LAB — LIPID PANEL
Cholesterol: 199 mg/dL (ref <200)
HDL: 78 mg/dL (ref >50)
LDL Cholesterol: 97 mg/dL (ref <100)
TRIGLYCERIDES: 120 mg/dL (ref <150)
Total CHOL/HDL Ratio: 2.6 Ratio (ref <5.0)
VLDL: 24 mg/dL (ref <30)

## 2017-02-09 LAB — TSH: TSH: 2.56 mIU/L

## 2017-02-09 MED ORDER — ZOLPIDEM TARTRATE 10 MG PO TABS: 10.0000 mg | ORAL_TABLET | Freq: Every evening | ORAL | 0 refills | Status: DC | PRN

## 2017-02-09 MED ORDER — ZOLPIDEM TARTRATE 10 MG PO TABS
10.0000 mg | ORAL_TABLET | Freq: Every evening | ORAL | 1 refills | Status: DC | PRN
Start: 2017-02-09 — End: 2017-03-11

## 2017-02-09 NOTE — Progress Notes (Signed)
   Subjective:    Patient ID: Summer Santiago, female    DOB: 01/20/1971, 46 y.o.   MRN: [id number]  HPI  Pt is a 46 yo female who presents to the clinic for medication follow up.   Hypothyroidism- taking medication daily. No compliants or concerns.   Needs lipids checked due to being on livalo.   Dr. Tam[facility] would like liver enzymes due to being on methotrexate for psorasis.      Review of Systems  All other systems reviewed and are negative.      Objective:   Physical Exam  Constitutional: She is oriented to person, place, and time. She appears well-developed and well-nourished.  HENT:  Head: Normocephalic and atraumatic.  Eyes: Conjunctivae are normal.  Neck: Normal range of motion. Neck supple.  Cardiovascular: Normal rate, regular rhythm and normal heart sounds.   Pulmonary/Chest: Effort normal and breath sounds normal.  Neurological: She is alert and oriented to person, place, and time.  Psychiatric: She has a normal mood and affect. Her behavior is normal.          Assessment & Plan:  Marland KitchenMarland KitchenDiagnoses and all orders for this visit:  Hypothyroidism, unspecified type -     TSH -     T4, free  Vitamin D deficiency -     VITAMIN D 25 Hydroxy (Vit-D Deficiency, Fractures)  Pure hypercholesterolemia -     Lipid panel -     Pitavastatin Calcium (LIVALO) 2 MG TABS; TAKE 1 TABLET BY MOUTH  DAILY.  Medication management -     COMPLETE METABOLIC PANEL WITH GFR  Visit for screening mammogram -     MM DIGITAL SCREENING BILATERAL; Future  Other orders -     Discontinue: zolpidem (AMBIEN) 10 MG tablet; Take 1 tablet (10 mg total) by mouth at bedtime as needed for sleep. -     zolpidem (AMBIEN) 10 MG tablet; Take 1 tablet (10 mg total) by mouth at bedtime as needed for sleep. -     levothyroxine (SYNTHROID, LEVOTHROID) 88 MCG tablet; Take 1 tablet (88 mcg total) by mouth daily before breakfast.

## 2017-02-10 LAB — VITAMIN D 25 HYDROXY (VIT D DEFICIENCY, FRACTURES): Vit D, 25-Hydroxy: 28 ng/mL — ABNORMAL LOW (ref 30–100)

## 2017-02-11 ENCOUNTER — Other Ambulatory Visit: Payer: Self-pay | Admitting: *Deleted

## 2017-02-11 ENCOUNTER — Encounter: Payer: Self-pay | Admitting: Physician Assistant

## 2017-02-11 DIAGNOSIS — E7800 Pure hypercholesterolemia, unspecified: Secondary | ICD-10-CM

## 2017-02-11 MED ORDER — PITAVASTATIN CALCIUM 2 MG PO TABS: ORAL_TABLET | ORAL | 4 refills | Status: DC

## 2017-02-11 MED ORDER — LEVOTHYROXINE SODIUM 88 MCG PO TABS: 88.0000 ug | ORAL_TABLET | Freq: Every day | ORAL | 4 refills | Status: DC

## 2017-03-11 ENCOUNTER — Telehealth: Payer: Self-pay

## 2017-03-11 ENCOUNTER — Other Ambulatory Visit: Payer: Self-pay

## 2017-03-11 MED ORDER — ZOLPIDEM TARTRATE 10 MG PO TABS: 10.0000 mg | ORAL_TABLET | Freq: Every evening | ORAL | 0 refills | Status: DC | PRN

## 2017-03-11 NOTE — Telephone Encounter (Signed)
Notified patient.

## 2017-03-11 NOTE — Telephone Encounter (Signed)
Called in 7 pills.

## 2017-03-11 NOTE — Telephone Encounter (Signed)
Yes, ok 

## 2017-03-11 NOTE — Telephone Encounter (Signed)
OK for 5 tab.

## 2017-03-11 NOTE — Telephone Encounter (Signed)
Can I send a 7 day supply of synthroid?  Please advise.

## 2017-03-11 NOTE — Telephone Encounter (Signed)
Pt is out of town in [place], and is out of [place].  She is requesting 5 pills to get her through til she is back home.  [facility] has also been called to transfer some of her scripts incase you get something from them.  Please advise if this can be sent?

## 2017-03-11 NOTE — Telephone Encounter (Signed)
Will fax and notify patient

## 2017-05-11 ENCOUNTER — Encounter: Payer: Self-pay | Admitting: Physician Assistant

## 2017-05-11 ENCOUNTER — Ambulatory Visit (INDEPENDENT_AMBULATORY_CARE_PROVIDER_SITE_OTHER): Payer: 59 | Admitting: Physician Assistant

## 2017-05-11 VITALS — BP 135/92 | HR 73 | Ht 67.0 in | Wt 173.0 lb

## 2017-05-11 DIAGNOSIS — L299 Pruritus, unspecified: Secondary | ICD-10-CM | POA: Diagnosis not present

## 2017-05-11 DIAGNOSIS — R21 Rash and other nonspecific skin eruption: Secondary | ICD-10-CM | POA: Diagnosis not present

## 2017-05-11 DIAGNOSIS — R030 Elevated blood-pressure reading, without diagnosis of hypertension: Secondary | ICD-10-CM | POA: Diagnosis not present

## 2017-05-11 LAB — CBC WITH DIFFERENTIAL/PLATELET
BASOS PCT: 1 %
Basophils Absolute: 57 cells/uL (ref 0–200)
EOS ABS: 57 {cells}/uL (ref 15–500)
Eosinophils Relative: 1 %
HEMATOCRIT: 41 % (ref 35.0–45.0)
Hemoglobin: 14.3 g/dL (ref 11.7–15.5)
LYMPHS PCT: 21 %
Lymphs Abs: 1197 cells/uL (ref 850–3900)
MCH: 33.9 pg — ABNORMAL HIGH (ref 27.0–33.0)
MCHC: 34.9 g/dL (ref 32.0–36.0)
MCV: 97.2 fL (ref 80.0–100.0)
MONO ABS: 456 {cells}/uL (ref 200–950)
MPV: 9.3 fL (ref 7.5–12.5)
Monocytes Relative: 8 %
Neutro Abs: 3933 cells/uL (ref 1500–7800)
Neutrophils Relative %: 69 %
Platelets: 193 10*3/uL (ref 140–400)
RBC: 4.22 MIL/uL (ref 3.80–5.10)
RDW: 14.2 % (ref 11.0–15.0)
WBC: 5.7 10*3/uL (ref 3.8–10.8)

## 2017-05-11 MED ORDER — HYDROXYZINE HCL 10 MG PO TABS
10.0000 mg | ORAL_TABLET | Freq: Three times a day (TID) | ORAL | 0 refills | Status: DC | PRN
Start: 2017-05-11 — End: 2017-12-23

## 2017-05-11 MED ORDER — TRIAMCINOLONE ACETONIDE 0.1 % EX CREA: 1.0000 "application " | TOPICAL_CREAM | Freq: Two times a day (BID) | CUTANEOUS | 1 refills | Status: DC

## 2017-05-11 MED ORDER — PREDNISONE 20 MG PO TABS: ORAL_TABLET | ORAL | 0 refills | Status: DC

## 2017-05-15 MED ORDER — CLOTRIMAZOLE 1 % EX CREA: 1.0000 "application " | TOPICAL_CREAM | Freq: Two times a day (BID) | CUTANEOUS | 0 refills | Status: DC

## 2017-05-16 ENCOUNTER — Encounter: Payer: Self-pay | Admitting: Physician Assistant

## 2017-05-16 NOTE — Progress Notes (Signed)
   Subjective:    Patient ID: CHAIRTY TOMAN, female    DOB: [birth year], 46 y.o.   MRN: [id number]  HPI Pt is a 46 yo female who presents to the clinic with 1 month of itchy rash mostly on her chest, trunk, arms and legs. The worst on her chest. She denies any lotion, deodorant, laundry, or chemical changes. She denies any sun burns but has had increased sun exposure. She was seen about 2 weeks ago in [facility] for poison ivy in genitals after peeing in the woods. It cleared almost completely after treatment. She still does have a little rash left. No fever, chills, nausea, vomiting. Used cortisone with little relief.   .. Active Ambulatory Problems    Diagnosis Date Noted  . Hypothyroidism 11/26/2013  . Hyperlipidemia 11/26/2013  . Insomnia 11/26/2013  . Vitamin D deficiency 12/27/2013  . Rosacea 11/13/2014  . Pelvic mass in female 02/14/2015  . Uterine fibroid 03/03/2015  . Fibroids 05/09/2015  . Right shoulder pain 11/13/2015  . Psoriasis 03/02/2016  . Elevated BP 03/02/2016   Resolved Ambulatory Problems    Diagnosis Date Noted  . No Resolved Ambulatory Problems   Past Medical History:  Diagnosis Date  . Hyperlipidemia   . Hypothyroidism   . Thyroid disease       Review of Systems See HPI.     Objective:   Physical Exam  Pulmonary/Chest:            Assessment & Plan:  Marland KitchenMarland KitchenYudit was seen today for rash and hypertension.  Diagnoses and all orders for this visit:  Rash and nonspecific skin eruption -     CBC with Differential/Platelet -     predniSONE (DELTASONE) 20 MG tablet; Take 3 tablets for 3 days, take 2 tablets for 3 days, take 1 tablet for 3 days, take 1/2 tablet for 4 days. -     hydrOXYzine (ATARAX/VISTARIL) 10 MG tablet; Take 1 tablet (10 mg total) by mouth 3 (three) times daily as needed. For itching. -     triamcinolone cream (KENALOG) 0.1 %; Apply 1 application topically 2 (two) times daily.  Elevated blood pressure reading  Pruritus -      hydrOXYzine (ATARAX/VISTARIL) 10 MG tablet; Take 1 tablet (10 mg total) by mouth 3 (three) times daily as needed. For itching.  Groin rash -     clotrimazole (LOTRIMIN) 1 % cream; Apply 1 application topically 2 (two) times daily.   BP is elevated but mildly. Reassurance that let's try to clear rash up and see if BP still elevated. Continue to monitor. Call if BP ranging above 140/90.   Rash appears like dermatitis. Maybe from sun. Topical steroid and prednisone given. After patient left thought to consider ID reaction and perhaps rash in groin was fungal instead of ivy. Sent antifungal to treat groin rash. Keep derm appt next week.

## 2017-06-10 ENCOUNTER — Other Ambulatory Visit: Payer: Self-pay | Admitting: *Deleted

## 2017-06-10 ENCOUNTER — Telehealth: Payer: Self-pay | Admitting: Physician Assistant

## 2017-06-10 MED ORDER — ZOLPIDEM TARTRATE 10 MG PO TABS: 10.0000 mg | ORAL_TABLET | Freq: Every evening | ORAL | 0 refills | Status: DC | PRN

## 2017-06-10 NOTE — Telephone Encounter (Signed)
Spoke with patient and everything is taken care of

## 2017-06-10 NOTE — Telephone Encounter (Signed)
Pt called. Pharmacy has not heard back from [place] on request for refill on a med but patient could not tell me which med it is. Thank you.

## 2017-06-13 ENCOUNTER — Other Ambulatory Visit: Payer: Self-pay | Admitting: *Deleted

## 2017-06-13 DIAGNOSIS — E7800 Pure hypercholesterolemia, unspecified: Secondary | ICD-10-CM

## 2017-06-13 MED ORDER — PITAVASTATIN CALCIUM 2 MG PO TABS: ORAL_TABLET | ORAL | 4 refills | Status: DC

## 2017-06-17 ENCOUNTER — Other Ambulatory Visit: Payer: Self-pay | Admitting: Sports Medicine

## 2017-08-10 ENCOUNTER — Ambulatory Visit (INDEPENDENT_AMBULATORY_CARE_PROVIDER_SITE_OTHER): Payer: 59 | Admitting: Physician Assistant

## 2017-08-10 ENCOUNTER — Encounter: Payer: Self-pay | Admitting: Physician Assistant

## 2017-08-10 ENCOUNTER — Other Ambulatory Visit (HOSPITAL_COMMUNITY)
Admission: RE | Admit: 2017-08-10 | Discharge: 2017-08-10 | Disposition: A | Discharge status: Home / Self Care | Payer: 59 | Source: Ambulatory Visit | Attending: Physician Assistant | Admitting: Physician Assistant

## 2017-08-10 VITALS — BP 151/87 | HR 66 | Ht 67.0 in | Wt 176.0 lb

## 2017-08-10 DIAGNOSIS — Z0000 Encounter for general adult medical examination without abnormal findings: Secondary | ICD-10-CM | POA: Insufficient documentation

## 2017-08-10 DIAGNOSIS — Z1231 Encounter for screening mammogram for malignant neoplasm of breast: Secondary | ICD-10-CM

## 2017-08-10 DIAGNOSIS — E7800 Pure hypercholesterolemia, unspecified: Secondary | ICD-10-CM | POA: Diagnosis not present

## 2017-08-10 NOTE — Progress Notes (Signed)
Subjective:     Summer Santiago is a 46 y.o. female and is here for a comprehensive physical exam. The patient reports no problems.  Social History   Social History  . Marital status: Married    Spouse name: N/A  . Number of children: N/A  . Years of education: N/A   Occupational History  . Not on file.   Social History Main Topics  . Smoking status: Never Smoker  . Smokeless tobacco: Never Used  . Alcohol use 8.4 oz/week    14 Glasses of wine per week     Comment: daily wine  . Drug use: No  . Sexual activity: Yes    Birth control/ protection: None   Other Topics Concern  . Not on file   Social History Narrative  . No narrative on file   Health Maintenance  Topic Date Due  . MAMMOGRAM  04/09/2016  . PAP SMEAR  10/18/2016  . INFLUENZA VACCINE  08/10/2018 (Originally 07/06/2017)  . HIV Screening  08/11/2027 (Originally 08/15/1987)  . TETANUS/TDAP  10/06/2022    The following portions of the patient's history were reviewed and updated as appropriate: allergies, current medications, past family history, past medical history, past social history, past surgical history and problem list.  Review of Systems A comprehensive review of systems was negative.   Objective:    BP (!) 151/87   Pulse 66   Ht 5\' 7"  (1.702 m)   Wt 176 lb (79.8 kg)   LMP 04/29/2015 (Exact Date)   BMI 27.57 kg/m  General appearance: alert, cooperative and appears stated age Head: Normocephalic, without obvious abnormality, atraumatic Eyes: conjunctivae/corneas clear. PERRL, EOM's intact. Fundi benign. Ears: normal TM's and external ear canals both ears Nose: Nares normal. Septum midline. Mucosa normal. No drainage or sinus tenderness. Throat: lips, mucosa, and tongue normal; teeth and gums normal Neck: no adenopathy, no carotid bruit, no JVD, supple, symmetrical, trachea midline and thyroid not enlarged, symmetric, no tenderness/mass/nodules Back: symmetric, no curvature. ROM normal. No CVA  tenderness. Lungs: clear to auscultation bilaterally Breasts: normal appearance, no masses or tenderness Heart: regular rate and rhythm, S1, S2 normal, no murmur, click, rub or gallop Abdomen: soft, non-tender; bowel sounds normal; no masses,  no organomegaly Pelvic: external genitalia normal, no adnexal masses or tenderness, no cervical motion tenderness, uterus surgically absent and vagina normal without discharge Extremities: extremities normal, atraumatic, no cyanosis or edema Pulses: 2+ and symmetric Skin: Skin color, texture, turgor normal. No rashes or lesions Lymph nodes: Cervical, supraclavicular, and axillary nodes normal. Neurologic: Alert and oriented X 3, normal strength and tone. Normal symmetric reflexes. Normal coordination and gait    Assessment:    Healthy female exam.      Plan:    Marland KitchenMarland KitchenJamee was seen today for annual exam.  Diagnoses and all orders for this visit:  Routine physical examination -     Cancel: Cytology - PAP -     Cytology - PAP  Visit for screening mammogram -     MM SCREENING BREAST TOMO BILATERAL; Future -     MM SCREENING BREAST TOMO BILATERAL  PAP done today of vaginal tissue due to hystertectomy.  Pt declined flu shot.   .. Depression screen [facility] 2/9 08/12/2017 02/09/2017  Decreased Interest 0 0  Down, Depressed, Hopeless 0 0  PHQ - 2 Score 0 0   Labs up to date.   Discussed weight.  .. Discussed 150 minutes of exercise a week.  Encouraged vitamin D  1000 units and Calcium 1300mg  or 4 servings of dairy a day.  Marland Kitchen.Discussed low carb diet with 1500 calories and 80g of protein.  My Fitness Pal could be a Microbiologist.      See After Visit Summary for Counseling Recommendations

## 2017-08-10 NOTE — Patient Instructions (Signed)

## 2017-08-12 LAB — CYTOLOGY - PAP
DIAGNOSIS: NEGATIVE
HPV (WINDOPATH): NOT DETECTED

## 2017-09-09 ENCOUNTER — Ambulatory Visit
Admission: RE | Admit: 2017-09-09 | Discharge: 2017-09-09 | Disposition: A | Discharge status: Home / Self Care | Payer: 59 | Source: Ambulatory Visit | Attending: Physician Assistant | Admitting: Physician Assistant

## 2017-09-12 ENCOUNTER — Encounter: Payer: Self-pay | Admitting: Physician Assistant

## 2017-09-12 ENCOUNTER — Other Ambulatory Visit: Payer: Self-pay | Admitting: Physician Assistant

## 2017-09-12 DIAGNOSIS — R928 Other abnormal and inconclusive findings on diagnostic imaging of breast: Secondary | ICD-10-CM

## 2017-09-12 DIAGNOSIS — N6310 Unspecified lump in the right breast, unspecified quadrant: Secondary | ICD-10-CM | POA: Insufficient documentation

## 2017-09-15 ENCOUNTER — Other Ambulatory Visit: Payer: Self-pay | Admitting: *Deleted

## 2017-09-15 MED ORDER — ZOLPIDEM TARTRATE 10 MG PO TABS: 10.0000 mg | ORAL_TABLET | Freq: Every evening | ORAL | 0 refills | Status: DC | PRN

## 2017-09-23 ENCOUNTER — Ambulatory Visit
Admission: RE | Admit: 2017-09-23 | Discharge: 2017-09-23 | Disposition: A | Discharge status: Home / Self Care | Payer: 59 | Source: Ambulatory Visit | Attending: Physician Assistant | Admitting: Physician Assistant

## 2017-09-23 DIAGNOSIS — R928 Other abnormal and inconclusive findings on diagnostic imaging of breast: Secondary | ICD-10-CM

## 2017-10-05 ENCOUNTER — Telehealth: Payer: Self-pay

## 2017-10-05 NOTE — Telephone Encounter (Signed)
Livalo approved on 09/15/2017-09/15/2018 with [facility].

## 2017-12-23 ENCOUNTER — Ambulatory Visit: Payer: 59 | Admitting: Sports Medicine

## 2017-12-23 ENCOUNTER — Ambulatory Visit (INDEPENDENT_AMBULATORY_CARE_PROVIDER_SITE_OTHER): Payer: 59 | Admitting: Physician Assistant

## 2017-12-23 ENCOUNTER — Encounter: Payer: Self-pay | Admitting: Physician Assistant

## 2017-12-23 ENCOUNTER — Ambulatory Visit
Admission: RE | Admit: 2017-12-23 | Discharge: 2017-12-23 | Disposition: A | Discharge status: Home / Self Care | Payer: 59 | Source: Ambulatory Visit | Attending: Physician Assistant | Admitting: Physician Assistant

## 2017-12-23 VITALS — BP 131/81 | HR 69 | Temp 97.5°F | Ht 67.0 in | Wt 175.0 lb

## 2017-12-23 DIAGNOSIS — R1032 Left lower quadrant pain: Secondary | ICD-10-CM | POA: Diagnosis not present

## 2017-12-23 DIAGNOSIS — K529 Noninfective gastroenteritis and colitis, unspecified: Secondary | ICD-10-CM

## 2017-12-23 DIAGNOSIS — K6389 Other specified diseases of intestine: Secondary | ICD-10-CM

## 2017-12-23 MED ORDER — METRONIDAZOLE 500 MG PO TABS: 500.0000 mg | ORAL_TABLET | Freq: Three times a day (TID) | ORAL | 0 refills | Status: DC

## 2017-12-23 MED ORDER — IOPAMIDOL (ISOVUE-300) INJECTION 61%
100.0000 mL | Freq: Once | INTRAVENOUS | Status: AC | PRN
  Administered 2017-12-23: 100 mL via INTRAVENOUS

## 2017-12-23 MED ORDER — CIPROFLOXACIN HCL 500 MG PO TABS: 500.0000 mg | ORAL_TABLET | Freq: Two times a day (BID) | ORAL | 0 refills | Status: DC

## 2017-12-23 NOTE — Addendum Note (Signed)
Addended by: Donella Stade on: 12/23/2017 05:21 PM   Modules accepted: Level of Service

## 2017-12-23 NOTE — Progress Notes (Addendum)
Subjective:    Patient ID: Summer Santiago, female    DOB: 1971-01-14, 47 y.o.   MRN: [id number]  HPI  Pt is a 47 year old female with PMH of a uterine fibroid followed by a hysterectomy that presents with an acute onset left lower quadrant pain. She reports that two weeks ago it began as a sensation of fullness in the left lower quadrant that is tender to palpation. She said depending on the amount of palpation she can have pain levels from 6-9/10. She says its worse with lateral flexion and is not relieved by OTC pain medications like advil. She denies  A history of kidney stones, denies discomfort with urination, blood in urine, and no fevers. She describes her diet as including lots of salad and red meats but her diet varies as she travels a lot resulting in lots of meals out at resturants. Denies changes in bowel movements. Denies fever, chills, fatigue.   .. Active Ambulatory Problems    Diagnosis Date Noted  . Hypothyroidism 11/26/2013  . Hyperlipidemia 11/26/2013  . Insomnia 11/26/2013  . Vitamin D deficiency 12/27/2013  . Rosacea 11/13/2014  . Pelvic mass in female 02/14/2015  . Uterine fibroid 03/03/2015  . Fibroids 05/09/2015  . Right shoulder pain 11/13/2015  . Psoriasis 03/02/2016  . Elevated BP 03/02/2016  . Breast mass, right 09/12/2017  . Epiploic appendagitis 12/23/2017   Resolved Ambulatory Problems    Diagnosis Date Noted  . No Resolved Ambulatory Problems   Past Medical History:  Diagnosis Date  . Hyperlipidemia   . Hypothyroidism   . Thyroid disease        Review of Systems  Constitutional: Negative for chills, fatigue and fever.  Gastrointestinal: Positive for abdominal pain. Negative for blood in stool, constipation, diarrhea and nausea.  Genitourinary: Negative for dysuria and flank pain.       Objective:   Physical Exam  Constitutional: She is oriented to person, place, and time. She appears well-developed and well-nourished. No distress.    HENT:  Head: Normocephalic and atraumatic.  Cardiovascular: Normal rate, regular rhythm and normal heart sounds.  Pulmonary/Chest: Effort normal and breath sounds normal.  Abdominal: Soft. Bowel sounds are normal. There is tenderness in the left lower quadrant. There is guarding. There is no rigidity, no rebound, no tenderness at McBurney's point and negative Murphy's sign.    Left lower quadrant pain to light and deep palpation. No flank pain.   Neurological: She is alert and oriented to person, place, and time. She exhibits normal muscle tone.  Psychiatric: She has a normal mood and affect. Her behavior is normal.   Vitals:   12/23/17 0928  BP: 131/81  Pulse: 69  Temp: (!) 97.5 F (36.4 C)       Assessment & Plan:   Diagnoses and all orders for this visit:  Epiploic appendagitis  Left lower quadrant pain -     CBC with Differential/Platelet -     COMPLETE METABOLIC PANEL WITH GFR -     CT Abdomen Pelvis W Contrast -     ciprofloxacin (CIPRO) 500 MG tablet; Take 1 tablet (500 mg total) by mouth 2 (two) times daily. For 10 days. -     metroNIDAZOLE (FLAGYL) 500 MG tablet; Take 1 tablet (500 mg total) by mouth 3 (three) times daily. For 10 days.   Pt has been given an educational handout on diverticulitis as I expect the dx will be pending CT. She will go to [facility]  imaging to get a CT abdomen and pelvis with contrast to confirm. We will start empiric antibiotics that she will continue for 10 days. She should not drink alcohol while on Flagyl as it will cause nausea and vomiting.  She should follow up if her pain worsens or stays the same after starting her antibiotic regiment. A few weeks after healing from the diverticulitis she should begin a high fiber diet or fiber supplements to keep her bowel movements regular and help prevent recurrent diverticulitis.   Addendum: CT was negative for diverticulitis. It did show omentum infarct consistent with epiploic appendagitis. Pt  was instructed to NOT take abx unless her CBC results come in with elevated WBC. She was told to start ibuprofen 600mg  q8hrs for 7 days and to hold humira injection for this week. Follow up in 7 days or sooner if symptoms worsen.

## 2017-12-23 NOTE — Progress Notes (Signed)
Pt aware of results. I called her.  Confirm she had cbc done. I want to make sure WBC is normal since for now I told her not to take antibiotics.   Hold this weeks humira but can restart next week.   Continue ibuprofen 600mg  q8rs for 7 days.

## 2017-12-23 NOTE — Patient Instructions (Signed)
Diverticulitis °Diverticulitis is infection or inflammation of small pouches (diverticula) in the colon that form due to a condition called diverticulosis. Diverticula can trap stool (feces) and bacteria, causing infection and inflammation. °Diverticulitis may cause severe stomach pain and diarrhea. It may lead to tissue damage in the colon that causes bleeding. The diverticula may also burst (rupture) and cause infected stool to enter other areas of the abdomen. °Complications of diverticulitis can include: °· Bleeding. °· Severe infection. °· Severe pain. °· Rupture (perforation) of the colon. °· Blockage (obstruction) of the colon. ° °What are the causes? °This condition is caused by stool becoming trapped in the diverticula, which allows bacteria to grow in the diverticula. This leads to inflammation and infection. °What increases the risk? °You are more likely to develop this condition if: °· You have diverticulosis. The risk for diverticulosis increases if: °? You are overweight or obese. °? You use tobacco products. °? You do not get enough exercise. °· You eat a diet that does not include enough fiber. High-fiber foods include fruits, vegetables, beans, nuts, and whole grains. ° °What are the signs or symptoms? °Symptoms of this condition may include: °· Pain and tenderness in the abdomen. The pain is normally located on the left side of the abdomen, but it may occur in other areas. °· Fever and chills. °· Bloating. °· Cramping. °· Nausea. °· Vomiting. °· Changes in bowel routines. °· Blood in your stool. ° °How is this diagnosed? °This condition is diagnosed based on: °· Your medical history. °· A physical exam. °· Tests to make sure there is nothing else causing your condition. These tests may include: °? Blood tests. °? Urine tests. °? Imaging tests of the abdomen, including X-rays, ultrasounds, MRIs, or CT scans. ° °How is this treated? °Most cases of this condition are mild and can be treated at home.  Treatment may include: °· Taking over-the-counter pain medicines. °· Following a clear liquid diet. °· Taking antibiotic medicines by mouth. °· Rest. ° °More severe cases may need to be treated at a hospital. Treatment may include: °· Not eating or drinking. °· Taking prescription pain medicine. °· Receiving antibiotic medicines through an IV tube. °· Receiving fluids and nutrition through an IV tube. °· Surgery. ° °When your condition is under control, your health care provider may recommend that you have a colonoscopy. This is an exam to look at the entire large intestine. During the exam, a lubricated, bendable tube is inserted into the anus and then passed into the rectum, colon, and other parts of the large intestine. A colonoscopy can show how severe your diverticula are and whether something else may be causing your symptoms. °Follow these instructions at home: °Medicines °· Take over-the-counter and prescription medicines only as told by your health care provider. These include fiber supplements, probiotics, and stool softeners. °· If you were prescribed an antibiotic medicine, take it as told by your health care provider. Do not stop taking the antibiotic even if you start to feel better. °· Do not drive or use heavy machinery while taking prescription pain medicine. °General instructions °· Follow a full liquid diet or another diet as directed by your health care provider. After your symptoms improve, your health care provider may tell you to change your diet. He or she may recommend that you eat a diet that contains at least 25 g (25 grams) of fiber daily. Fiber makes it easier to pass stool. Healthy sources of fiber include: °? Berries. One cup   contains 4-8 grams of fiber. °? Beans or lentils. One half cup contains 5-8 grams of fiber. °? Green vegetables. One cup contains 4 grams of fiber. °· Exercise for at least 30 minutes, 3 times each week. You should exercise hard enough to raise your heart rate and  break a sweat. °· Keep all follow-up visits as told by your health care provider. This is important. You may need a colonoscopy. °Contact a health care provider if: °· Your pain does not improve. °· You have a hard time drinking or eating food. °· Your bowel movements do not return to normal. °Get help right away if: °· Your pain gets worse. °· Your symptoms do not get better with treatment. °· Your symptoms suddenly get worse. °· You have a fever. °· You vomit more than one time. °· You have stools that are bloody, black, or tarry. °Summary °· Diverticulitis is infection or inflammation of small pouches (diverticula) in the colon that form due to a condition called diverticulosis. Diverticula can trap stool (feces) and bacteria, causing infection and inflammation. °· You are at higher risk for this condition if you have diverticulosis and you eat a diet that does not include enough fiber. °· Most cases of this condition are mild and can be treated at home. More severe cases may need to be treated at a hospital. °· When your condition is under control, your health care provider may recommend that you have an exam called a colonoscopy. This exam can show how severe your diverticula are and whether something else may be causing your symptoms. °This information is not intended to replace advice given to you by your health care provider. Make sure you discuss any questions you have with your health care provider. °Document Released: 09/01/2005 Document Revised: 12/25/2016 Document Reviewed: 12/25/2016 °Elsevier Interactive Patient Education © 2018 [employer] ° °

## 2018-01-16 ENCOUNTER — Other Ambulatory Visit: Payer: Self-pay | Admitting: Family Medicine

## 2018-01-16 ENCOUNTER — Encounter: Payer: Self-pay | Admitting: Physician Assistant

## 2018-01-17 MED ORDER — ZOLPIDEM TARTRATE 10 MG PO TABS: 10.0000 mg | ORAL_TABLET | Freq: Every evening | ORAL | 0 refills | Status: DC | PRN

## 2018-01-17 NOTE — Telephone Encounter (Signed)
Left VM advising that Rx was sent.

## 2018-03-18 ENCOUNTER — Other Ambulatory Visit: Payer: Self-pay | Admitting: Family Medicine

## 2018-03-28 ENCOUNTER — Other Ambulatory Visit: Payer: Self-pay | Admitting: Physician Assistant

## 2018-04-23 ENCOUNTER — Other Ambulatory Visit: Payer: Self-pay | Admitting: Physician Assistant

## 2018-04-24 MED ORDER — ZOLPIDEM TARTRATE 10 MG PO TABS: 10.0000 mg | ORAL_TABLET | Freq: Every evening | ORAL | 0 refills | Status: DC | PRN

## 2018-04-24 NOTE — Telephone Encounter (Signed)
[facility]  Last RX sent on 01-17-18 for #90 with no RF Last OV 12-23-17 No future appts sched  RX pended. Please review and send if appropriate  Thanks!

## 2018-06-13 ENCOUNTER — Other Ambulatory Visit: Payer: Self-pay | Admitting: Physician Assistant

## 2018-07-30 ENCOUNTER — Other Ambulatory Visit: Payer: Self-pay | Admitting: Physician Assistant

## 2018-07-30 DIAGNOSIS — E7800 Pure hypercholesterolemia, unspecified: Secondary | ICD-10-CM

## 2018-08-22 ENCOUNTER — Other Ambulatory Visit: Payer: Self-pay | Admitting: Physician Assistant

## 2018-08-22 DIAGNOSIS — Z1231 Encounter for screening mammogram for malignant neoplasm of breast: Secondary | ICD-10-CM

## 2018-09-10 ENCOUNTER — Other Ambulatory Visit: Payer: Self-pay | Admitting: Family Medicine

## 2018-10-06 ENCOUNTER — Ambulatory Visit
Admission: RE | Admit: 2018-10-06 | Discharge: 2018-10-06 | Disposition: A | Discharge status: Home / Self Care | Payer: 59 | Source: Ambulatory Visit | Attending: Physician Assistant | Admitting: Physician Assistant

## 2018-10-06 DIAGNOSIS — Z1231 Encounter for screening mammogram for malignant neoplasm of breast: Secondary | ICD-10-CM

## 2018-10-06 NOTE — Progress Notes (Signed)
Call pt: normal mammogram. Follow up in 1 year.

## 2018-11-20 ENCOUNTER — Encounter: Payer: Self-pay | Admitting: Physician Assistant

## 2018-11-20 ENCOUNTER — Ambulatory Visit (INDEPENDENT_AMBULATORY_CARE_PROVIDER_SITE_OTHER): Payer: 59 | Admitting: Physician Assistant

## 2018-11-20 VITALS — BP 126/94 | HR 94 | Ht 67.0 in | Wt 183.0 lb

## 2018-11-20 DIAGNOSIS — Z131 Encounter for screening for diabetes mellitus: Secondary | ICD-10-CM | POA: Diagnosis not present

## 2018-11-20 DIAGNOSIS — F5101 Primary insomnia: Secondary | ICD-10-CM

## 2018-11-20 DIAGNOSIS — E782 Mixed hyperlipidemia: Secondary | ICD-10-CM | POA: Diagnosis not present

## 2018-11-20 DIAGNOSIS — T887XXA Unspecified adverse effect of drug or medicament, initial encounter: Secondary | ICD-10-CM | POA: Diagnosis not present

## 2018-11-20 DIAGNOSIS — E039 Hypothyroidism, unspecified: Secondary | ICD-10-CM | POA: Diagnosis not present

## 2018-11-20 DIAGNOSIS — E7800 Pure hypercholesterolemia, unspecified: Secondary | ICD-10-CM

## 2018-11-20 DIAGNOSIS — E559 Vitamin D deficiency, unspecified: Secondary | ICD-10-CM

## 2018-11-20 MED ORDER — LEVOTHYROXINE SODIUM 88 MCG PO TABS: 88.0000 ug | ORAL_TABLET | Freq: Every day | ORAL | 3 refills | Status: DC

## 2018-11-20 MED ORDER — PITAVASTATIN CALCIUM 2 MG PO TABS: ORAL_TABLET | ORAL | 3 refills | Status: DC

## 2018-11-20 MED ORDER — ZOLPIDEM TARTRATE 10 MG PO TABS: 10.0000 mg | ORAL_TABLET | Freq: Every evening | ORAL | 2 refills | Status: DC | PRN

## 2018-11-20 NOTE — Progress Notes (Addendum)
   Subjective:    Patient ID: Summer Santiago, female    DOB: 01-09-1971, 47 y.o.   MRN: [id number]  HPI Pt is a 47 yo female with hypothyroidism, HLD who presents to the clinic for follow up and medication refills.   Overall patient is doing great. No CP, palpitations, headaches, or vision changes. She has been taking her medication daily. She recently ran out of levothyroxine. Controlled on same dose for many years. She is not checking BP at home.   She has noticed some hair loss and wonders about her thyroid.   .. Active Ambulatory Problems    Diagnosis Date Noted  . Hypothyroidism 11/26/2013  . Hyperlipidemia 11/26/2013  . Insomnia 11/26/2013  . Vitamin D deficiency 12/27/2013  . Rosacea 11/13/2014  . Pelvic mass in female 02/14/2015  . Uterine fibroid 03/03/2015  . Fibroids 05/09/2015  . Right shoulder pain 11/13/2015  . Psoriasis 03/02/2016  . Elevated BP 03/02/2016  . Breast mass, right 09/12/2017  . Epiploic appendagitis 12/23/2017   Resolved Ambulatory Problems    Diagnosis Date Noted  . No Resolved Ambulatory Problems   Past Medical History:  Diagnosis Date  . Thyroid disease    .Marland Kitchen Family History  Problem Relation Age of Onset  . Congestive Heart Failure Mother   . Gout Father   . Diabetes Paternal Grandmother   . Cancer Paternal Grandmother        non-lymphoma of the brain  . Stroke Paternal Grandfather   . Diabetes Maternal Grandmother       Review of Systems    see HPI.  Objective:   Physical Exam HENT:     Head: Normocephalic and atraumatic.  Neck:     Musculoskeletal: Normal range of motion.  Cardiovascular:     Rate and Rhythm: Normal rate and regular rhythm.  Pulmonary:     Effort: Pulmonary effort is normal.     Breath sounds: Normal breath sounds.  Neurological:     General: No focal deficit present.     Mental Status: She is alert.  Psychiatric:        Mood and Affect: Mood normal.        Behavior: Behavior normal.            Assessment & Plan:  Marland KitchenMarland KitchenNea was seen today for follow-up.  Diagnoses and all orders for this visit:  Mixed hyperlipidemia -     Lipid Panel w/reflex Direct LDL  Acquired hypothyroidism -     TSH -     levothyroxine (SYNTHROID, LEVOTHROID) 88 MCG tablet; Take 1 tablet (88 mcg total) by mouth daily before breakfast.  Screening for diabetes mellitus -     COMPLETE METABOLIC PANEL WITH GFR  Medication side effect -     CBC with Differential/Platelet  Vitamin D deficiency -     VITAMIN D 25 Hydroxy (Vit-D Deficiency, Fractures)  Pure hypercholesterolemia -     Pitavastatin Calcium (LIVALO) 2 MG TABS; TAKE 1 TABLET BY MOUTH  DAILY.  Primary insomnia -     zolpidem (AMBIEN) 10 MG tablet; Take 1 tablet (10 mg total) by mouth at bedtime as needed for sleep.   Refilled medications.  Labs ordered.  Follow up in 6 months.

## 2018-11-20 NOTE — Patient Instructions (Signed)
DASH Eating Plan DASH stands for "Dietary Approaches to Stop Hypertension." The DASH eating plan is a healthy eating plan that has been shown to reduce high blood pressure (hypertension). It may also reduce your risk for type 2 diabetes, heart disease, and stroke. The DASH eating plan may also help with weight loss. What are tips for following this plan? General guidelines  Avoid eating more than 2,300 mg (milligrams) of salt (sodium) a day. If you have hypertension, you may need to reduce your sodium intake to 1,500 mg a day.  Limit alcohol intake to no more than 1 drink a day for nonpregnant women and 2 drinks a day for men. One drink equals 12 oz of beer, 5 oz of wine, or 1 oz of hard liquor.  Work with your health care provider to maintain a healthy body weight or to lose weight. Ask what an ideal weight is for you.  Get at least 30 minutes of exercise that causes your heart to beat faster (aerobic exercise) most days of the week. Activities may include walking, swimming, or biking.  Work with your health care provider or diet and nutrition specialist (dietitian) to adjust your eating plan to your individual calorie needs. Reading food labels  Check food labels for the amount of sodium per serving. Choose foods with less than 5 percent of the Daily Value of sodium. Generally, foods with less than 300 mg of sodium per serving fit into this eating plan.  To find whole grains, look for the word "whole" as the first word in the ingredient list. Shopping  Buy products labeled as "low-sodium" or "no salt added."  Buy fresh foods. Avoid canned foods and premade or frozen meals. Cooking  Avoid adding salt when cooking. Use salt-free seasonings or herbs instead of table salt or sea salt. Check with your health care provider or pharmacist before using salt substitutes.  Do not fry foods. Cook foods using healthy methods such as baking, boiling, grilling, and broiling instead.  Cook with  heart-healthy oils, such as olive, canola, soybean, or sunflower oil. Meal planning   Eat a balanced diet that includes: ? 5 or more servings of fruits and vegetables each day. At each meal, try to fill half of your plate with fruits and vegetables. ? Up to 6-8 servings of whole grains each day. ? Less than 6 oz of lean meat, poultry, or fish each day. A 3-oz serving of meat is about the same size as a deck of cards. One egg equals 1 oz. ? 2 servings of low-fat dairy each day. ? A serving of nuts, seeds, or beans 5 times each week. ? Heart-healthy fats. Healthy fats called Omega-3 fatty acids are found in foods such as flaxseeds and coldwater fish, like sardines, salmon, and mackerel.  Limit how much you eat of the following: ? Canned or prepackaged foods. ? Food that is high in trans fat, such as fried foods. ? Food that is high in saturated fat, such as fatty meat. ? Sweets, desserts, sugary drinks, and other foods with added sugar. ? Full-fat dairy products.  Do not salt foods before eating.  Try to eat at least 2 vegetarian meals each week.  Eat more home-cooked food and less restaurant, buffet, and fast food.  When eating at a restaurant, ask that your food be prepared with less salt or no salt, if possible. What foods are recommended? The items listed may not be a complete list. Talk with your dietitian about what   dietary choices are best for you. Grains Whole-grain or whole-wheat bread. Whole-grain or whole-wheat pasta. Brown rice. Oatmeal. Quinoa. Bulgur. Whole-grain and low-sodium cereals. Pita bread. Low-fat, low-sodium crackers. Whole-wheat flour tortillas. Vegetables Fresh or frozen vegetables (raw, steamed, roasted, or grilled). Low-sodium or reduced-sodium tomato and vegetable juice. Low-sodium or reduced-sodium tomato sauce and tomato paste. Low-sodium or reduced-sodium canned vegetables. Fruits All fresh, dried, or frozen fruit. Canned fruit in natural juice (without  added sugar). Meat and other protein foods Skinless chicken or turkey. Ground chicken or turkey. Pork with fat trimmed off. Fish and seafood. Egg whites. Dried beans, peas, or lentils. Unsalted nuts, nut butters, and seeds. Unsalted canned beans. Lean cuts of beef with fat trimmed off. Low-sodium, lean deli meat. Dairy Low-fat (1%) or fat-free (skim) milk. Fat-free, low-fat, or reduced-fat cheeses. Nonfat, low-sodium ricotta or cottage cheese. Low-fat or nonfat yogurt. Low-fat, low-sodium cheese. Fats and oils Soft margarine without trans fats. Vegetable oil. Low-fat, reduced-fat, or light mayonnaise and salad dressings (reduced-sodium). Canola, safflower, olive, soybean, and sunflower oils. Avocado. Seasoning and other foods Herbs. Spices. Seasoning mixes without salt. Unsalted popcorn and pretzels. Fat-free sweets. What foods are not recommended? The items listed may not be a complete list. Talk with your dietitian about what dietary choices are best for you. Grains Baked goods made with fat, such as croissants, muffins, or some breads. Dry pasta or rice meal packs. Vegetables Creamed or fried vegetables. Vegetables in a cheese sauce. Regular canned vegetables (not low-sodium or reduced-sodium). Regular canned tomato sauce and paste (not low-sodium or reduced-sodium). Regular tomato and vegetable juice (not low-sodium or reduced-sodium). Pickles. Olives. Fruits Canned fruit in a light or heavy syrup. Fried fruit. Fruit in cream or butter sauce. Meat and other protein foods Fatty cuts of meat. Ribs. Fried meat. Bacon. Sausage. Bologna and other processed lunch meats. Salami. Fatback. Hotdogs. Bratwurst. Salted nuts and seeds. Canned beans with added salt. Canned or smoked fish. Whole eggs or egg yolks. Chicken or turkey with skin. Dairy Whole or 2% milk, cream, and half-and-half. Whole or full-fat cream cheese. Whole-fat or sweetened yogurt. Full-fat cheese. Nondairy creamers. Whipped toppings.  Processed cheese and cheese spreads. Fats and oils Butter. Stick margarine. Lard. Shortening. Ghee. Bacon fat. Tropical oils, such as coconut, palm kernel, or palm oil. Seasoning and other foods Salted popcorn and pretzels. Onion salt, garlic salt, seasoned salt, table salt, and sea salt. Worcestershire sauce. Tartar sauce. Barbecue sauce. Teriyaki sauce. Soy sauce, including reduced-sodium. Steak sauce. Canned and packaged gravies. Fish sauce. Oyster sauce. Cocktail sauce. Horseradish that you find on the shelf. Ketchup. Mustard. Meat flavorings and tenderizers. Bouillon cubes. Hot sauce and Tabasco sauce. Premade or packaged marinades. Premade or packaged taco seasonings. Relishes. Regular salad dressings. Where to find more information:  National Heart, Lung, and Blood Institute: www.nhlbi.nih.gov  American Heart Association: www.heart.org Summary  The DASH eating plan is a healthy eating plan that has been shown to reduce high blood pressure (hypertension). It may also reduce your risk for type 2 diabetes, heart disease, and stroke.  With the DASH eating plan, you should limit salt (sodium) intake to 2,300 mg a day. If you have hypertension, you may need to reduce your sodium intake to 1,500 mg a day.  When on the DASH eating plan, aim to eat more fresh fruits and vegetables, whole grains, lean proteins, low-fat dairy, and heart-healthy fats.  Work with your health care provider or diet and nutrition specialist (dietitian) to adjust your eating plan to your individual   calorie needs. This information is not intended to replace advice given to you by your health care provider. Make sure you discuss any questions you have with your health care provider. Document Released: 11/11/2011 Document Revised: 11/15/2016 Document Reviewed: 11/15/2016 Elsevier Interactive Patient Education  2018 Elsevier Inc.  

## 2018-11-21 LAB — CBC WITH DIFFERENTIAL/PLATELET
Absolute Monocytes: 299 cells/uL (ref 200–950)
BASOS ABS: 40 {cells}/uL (ref 0–200)
Basophils Relative: 1.1 %
EOS PCT: 2.2 %
Eosinophils Absolute: 79 cells/uL (ref 15–500)
HEMATOCRIT: 43 % (ref 35.0–45.0)
Hemoglobin: 15 g/dL (ref 11.7–15.5)
LYMPHS ABS: 1062 {cells}/uL (ref 850–3900)
MCH: 33.7 pg — ABNORMAL HIGH (ref 27.0–33.0)
MCHC: 34.9 g/dL (ref 32.0–36.0)
MCV: 96.6 fL (ref 80.0–100.0)
MPV: 10.3 fL (ref 7.5–12.5)
Monocytes Relative: 8.3 %
NEUTROS PCT: 58.9 %
Neutro Abs: 2120 cells/uL (ref 1500–7800)
Platelets: 219 10*3/uL (ref 140–400)
RBC: 4.45 10*6/uL (ref 3.80–5.10)
RDW: 13.1 % (ref 11.0–15.0)
Total Lymphocyte: 29.5 %
WBC: 3.6 10*3/uL — ABNORMAL LOW (ref 3.8–10.8)

## 2018-11-21 LAB — COMPLETE METABOLIC PANEL WITH GFR
AG RATIO: 1.4 (calc) (ref 1.0–2.5)
ALT: 87 U/L — AB (ref 6–29)
AST: 56 U/L — AB (ref 10–35)
Albumin: 4.2 g/dL (ref 3.6–5.1)
Alkaline phosphatase (APISO): 74 U/L (ref 33–115)
BUN: 8 mg/dL (ref 7–25)
CALCIUM: 9.3 mg/dL (ref 8.6–10.2)
CO2: 26 mmol/L (ref 20–32)
Chloride: 104 mmol/L (ref 98–110)
Creat: 0.77 mg/dL (ref 0.50–1.10)
GFR, EST NON AFRICAN AMERICAN: 93 mL/min/{1.73_m2} (ref >=60)
GFR, Est African American: 107 mL/min/{1.73_m2} (ref >=60)
GLUCOSE: 104 mg/dL — AB (ref 65–99)
Globulin: 2.9 g/dL (calc) (ref 1.9–3.7)
POTASSIUM: 4.2 mmol/L (ref 3.5–5.3)
Sodium: 140 mmol/L (ref 135–146)
Total Bilirubin: 0.3 mg/dL (ref 0.2–1.2)
Total Protein: 7.1 g/dL (ref 6.1–8.1)

## 2018-11-21 LAB — TSH: TSH: 2.39 m[IU]/L

## 2018-11-21 LAB — LIPID PANEL W/REFLEX DIRECT LDL
Cholesterol: 258 mg/dL — ABNORMAL HIGH (ref <200)
HDL: 60 mg/dL (ref >50)
LDL CHOLESTEROL (CALC): 157 mg/dL — AB
NON-HDL CHOLESTEROL (CALC): 198 mg/dL — AB (ref <130)
TRIGLYCERIDES: 249 mg/dL — AB (ref <150)
Total CHOL/HDL Ratio: 4.3 (calc) (ref <5.0)

## 2018-11-21 LAB — VITAMIN D 25 HYDROXY (VIT D DEFICIENCY, FRACTURES): Vit D, 25-Hydroxy: 59 ng/mL (ref 30–100)

## 2018-11-23 ENCOUNTER — Telehealth: Payer: Self-pay | Admitting: Family Medicine

## 2018-11-23 NOTE — Progress Notes (Signed)
Call pt: thyroid normal range.  Liver enzymes up just a hair. Recent tylenol or alcohol use?  TG up significantly from 1 year ago.  LDL up too.  HDL still looks good.  Were you out of livalo at all?  Glucose was just a tad elevated. Would like to add a1c to see what that is?  Vitamin D looks great.  WBC just a hair low but not concerning. Differential looks good.

## 2018-11-23 NOTE — Telephone Encounter (Signed)
Received fax from [employer] that Livalo requires a PA. Information has been sent to the insurance company. Awaiting determination.   

## 2018-11-24 NOTE — Telephone Encounter (Signed)
Received fax from [employer] that Livalo has been approved from 11/23/2018 through 11/24/2019. Form sent to scan. Pharmacy aware.

## 2018-12-04 ENCOUNTER — Encounter: Payer: Self-pay | Admitting: Physician Assistant

## 2019-05-18 ENCOUNTER — Ambulatory Visit: Payer: 59 | Admitting: Osteopathic Medicine

## 2019-06-06 ENCOUNTER — Encounter: Payer: Self-pay | Admitting: Physician Assistant

## 2019-06-06 ENCOUNTER — Other Ambulatory Visit: Payer: Self-pay | Admitting: Physician Assistant

## 2019-06-06 ENCOUNTER — Ambulatory Visit (INDEPENDENT_AMBULATORY_CARE_PROVIDER_SITE_OTHER): Payer: 59 | Admitting: Physician Assistant

## 2019-06-06 VITALS — Temp 96.3°F | Ht 67.0 in | Wt 175.0 lb

## 2019-06-06 DIAGNOSIS — L710 Perioral dermatitis: Secondary | ICD-10-CM

## 2019-06-06 DIAGNOSIS — E782 Mixed hyperlipidemia: Secondary | ICD-10-CM

## 2019-06-06 DIAGNOSIS — F5101 Primary insomnia: Secondary | ICD-10-CM

## 2019-06-06 DIAGNOSIS — E039 Hypothyroidism, unspecified: Secondary | ICD-10-CM | POA: Diagnosis not present

## 2019-06-06 MED ORDER — LEVOTHYROXINE SODIUM 88 MCG PO TABS
88.0000 ug | ORAL_TABLET | Freq: Every day | ORAL | 1 refills | Status: DC
Start: 2019-06-06 — End: 2019-12-14

## 2019-06-06 MED ORDER — ZOLPIDEM TARTRATE 10 MG PO TABS: 10.0000 mg | ORAL_TABLET | Freq: Every evening | ORAL | 1 refills | Status: DC | PRN

## 2019-06-06 MED ORDER — MINOCYCLINE HCL 100 MG PO CAPS: 100.0000 mg | ORAL_CAPSULE | Freq: Two times a day (BID) | ORAL | 5 refills | Status: DC

## 2019-06-06 NOTE — Progress Notes (Signed)
Patient following up for refills of Ambien and Levothyroxine. She is also having some issues with skin under mask. She has a history of peri-oral dermatitis and has taken minocycline. Requesting medication.

## 2019-06-09 ENCOUNTER — Encounter: Payer: Self-pay | Admitting: Physician Assistant

## 2019-06-09 DIAGNOSIS — L710 Perioral dermatitis: Secondary | ICD-10-CM | POA: Insufficient documentation

## 2019-06-09 NOTE — Progress Notes (Signed)
Patient ID: Summer Santiago, female   DOB: [birth year], 48 y.o.   MRN: [id number] .Marland KitchenVirtual Visit via Telephone Note  I connected with Summer Santiago on 06/09/19 at  3:00 PM EDT by telephone and verified that I am speaking with the correct person using two identifiers.  Location: Patient: work Provider: clinic   I discussed the limitations, risks, security and privacy concerns of performing an evaluation and management service by telephone and the availability of in person appointments. I also discussed with the patient that there may be a patient responsible charge related to this service. The patient expressed understanding and agreed to proceed.   History of Present Illness: Pt is a 48 yo female with insomnia, hypothyroidism, HLD, perioral dermatitis who presents to the clinic for medication refills.   She is doing really good. No problems with medications. 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 She has hx of perioral dermatitis. She has used minocycline in the past for flares. She feels like the mask is causing a flare. She has tried topical abx/benzyol perioxide. No benefit. Face, around mouth, nose and cheeks is red with tiny red bumps.     .. Active Ambulatory Problems    Diagnosis Date Noted  . Hypothyroidism 11/26/2013  . Hyperlipidemia 11/26/2013  . Insomnia 11/26/2013  . Vitamin D deficiency 12/27/2013  . Rosacea 11/13/2014  . Pelvic mass in female 02/14/2015  . Uterine fibroid 03/03/2015  . Fibroids 05/09/2015  . Right shoulder pain 11/13/2015  . Psoriasis 03/02/2016  . Elevated BP 03/02/2016  . Breast mass, right 09/12/2017  . Epiploic appendagitis 12/23/2017  . Perioral dermatitis 06/09/2019   Resolved Ambulatory Problems    Diagnosis Date Noted  . No Resolved Ambulatory Problems   Past Medical History:  Diagnosis Date  . Thyroid disease    Reviewed med, allergy, problem list.     Observations/Objective: No acute distress. Normal mood.    .. Today's Vitals   06/06/19 1321  Temp: (!) 96.3 F (35.7 C)  TempSrc: Oral  Weight: 175 lb (79.4 kg)  Height: 5\' 7"  (1.702 m)   Body mass index is 27.41 kg/m.    Assessment and Plan: Marland KitchenMarland KitchenNioka was seen today for insomnia and hypothyroidism.  Diagnoses and all orders for this visit:  Acquired hypothyroidism -     levothyroxine (SYNTHROID) 88 MCG tablet; Take 1 tablet (88 mcg total) by mouth daily before breakfast.  Primary insomnia -     zolpidem (AMBIEN) 10 MG tablet; Take 1 tablet (10 mg total) by mouth at bedtime as needed for sleep.  Mixed hyperlipidemia  Perioral dermatitis -     minocycline (MINOCIN) 100 MG capsule; Take 1 capsule (100 mg total) by mouth 2 (two) times daily.  labs up to date.  Refilled medication.   Minocycline has worked before well. Could also be some rosacea which minocycline works well for too. Sent to the pharmacy with refills. Discussed triggers and avoidance.    Follow Up Instructions:    I discussed the assessment and treatment plan with the patient. The patient was provided an opportunity to ask questions and all were answered. The patient agreed with the plan and demonstrated an understanding of the instructions.   The patient was advised to call back or seek an in-person evaluation if the symptoms worsen or if the condition fails to improve as anticipated.  I provided 10 minutes of non-face-to-face time during this encounter.   Iran Planas, PA-C

## 2019-09-21 ENCOUNTER — Other Ambulatory Visit: Payer: Self-pay | Admitting: Physician Assistant

## 2019-09-21 DIAGNOSIS — Z1231 Encounter for screening mammogram for malignant neoplasm of breast: Secondary | ICD-10-CM

## 2019-10-25 ENCOUNTER — Telehealth: Payer: Self-pay | Admitting: Physician Assistant

## 2019-10-25 NOTE — Telephone Encounter (Signed)
Received fax for prior authorization on Livalo 2 mg sent through cover my meds waiting on determination. - CF

## 2019-10-29 NOTE — Telephone Encounter (Signed)
Received fax from [employer] and they approved Livalo.  Case number: [id number] Valid: 11/19/02020 - 10/24/2020  I will notify pharmacy. - CF

## 2019-11-06 ENCOUNTER — Other Ambulatory Visit: Payer: Self-pay | Admitting: Physician Assistant

## 2019-11-06 DIAGNOSIS — F5101 Primary insomnia: Secondary | ICD-10-CM

## 2019-11-09 ENCOUNTER — Other Ambulatory Visit: Payer: Self-pay

## 2019-11-09 ENCOUNTER — Ambulatory Visit
Admission: RE | Admit: 2019-11-09 | Discharge: 2019-11-09 | Disposition: A | Discharge status: Home / Self Care | Payer: 59 | Source: Ambulatory Visit | Attending: Physician Assistant | Admitting: Physician Assistant

## 2019-11-09 DIAGNOSIS — Z1231 Encounter for screening mammogram for malignant neoplasm of breast: Secondary | ICD-10-CM

## 2019-11-09 NOTE — Progress Notes (Signed)
Normal mammogram. Follow up in 1 year.

## 2019-11-27 ENCOUNTER — Other Ambulatory Visit: Payer: Self-pay | Admitting: Neurology

## 2019-11-27 DIAGNOSIS — E7800 Pure hypercholesterolemia, unspecified: Secondary | ICD-10-CM

## 2019-11-27 MED ORDER — LIVALO 2 MG PO TABS: ORAL_TABLET | ORAL | 0 refills | Status: DC

## 2019-12-11 ENCOUNTER — Other Ambulatory Visit: Payer: Self-pay | Admitting: Physician Assistant

## 2019-12-11 DIAGNOSIS — E7800 Pure hypercholesterolemia, unspecified: Secondary | ICD-10-CM

## 2019-12-13 ENCOUNTER — Other Ambulatory Visit: Payer: Self-pay | Admitting: Physician Assistant

## 2019-12-13 DIAGNOSIS — E039 Hypothyroidism, unspecified: Secondary | ICD-10-CM

## 2020-01-08 ENCOUNTER — Other Ambulatory Visit: Payer: Self-pay | Admitting: Physician Assistant

## 2020-01-08 DIAGNOSIS — E7800 Pure hypercholesterolemia, unspecified: Secondary | ICD-10-CM

## 2020-02-04 ENCOUNTER — Other Ambulatory Visit: Payer: Self-pay | Admitting: Neurology

## 2020-02-04 DIAGNOSIS — F5101 Primary insomnia: Secondary | ICD-10-CM

## 2020-02-04 MED ORDER — ZOLPIDEM TARTRATE 10 MG PO TABS: 10.0000 mg | ORAL_TABLET | Freq: Every evening | ORAL | 0 refills | Status: DC | PRN

## 2020-02-04 NOTE — Progress Notes (Signed)
Done

## 2020-02-04 NOTE — Progress Notes (Signed)
Last filled 11/06/2019 #90 with no refills.  Last appt 06/06/2019. Pended with note that states needs appt.

## 2020-02-08 ENCOUNTER — Other Ambulatory Visit: Payer: Self-pay | Admitting: Physician Assistant

## 2020-02-08 DIAGNOSIS — F5101 Primary insomnia: Secondary | ICD-10-CM

## 2020-02-11 ENCOUNTER — Encounter: Payer: Self-pay | Admitting: Physician Assistant

## 2020-03-03 ENCOUNTER — Other Ambulatory Visit: Payer: Self-pay | Admitting: Physician Assistant

## 2020-03-03 DIAGNOSIS — E039 Hypothyroidism, unspecified: Secondary | ICD-10-CM

## 2020-03-14 ENCOUNTER — Telehealth (INDEPENDENT_AMBULATORY_CARE_PROVIDER_SITE_OTHER): Payer: 59 | Admitting: Physician Assistant

## 2020-03-14 ENCOUNTER — Encounter: Payer: Self-pay | Admitting: Physician Assistant

## 2020-03-14 VITALS — Temp 97.8°F | Ht 67.0 in | Wt 175.0 lb

## 2020-03-14 DIAGNOSIS — F5101 Primary insomnia: Secondary | ICD-10-CM | POA: Diagnosis not present

## 2020-03-14 DIAGNOSIS — E782 Mixed hyperlipidemia: Secondary | ICD-10-CM

## 2020-03-14 DIAGNOSIS — E039 Hypothyroidism, unspecified: Secondary | ICD-10-CM

## 2020-03-14 DIAGNOSIS — M2550 Pain in unspecified joint: Secondary | ICD-10-CM

## 2020-03-14 DIAGNOSIS — L409 Psoriasis, unspecified: Secondary | ICD-10-CM

## 2020-03-14 MED ORDER — LEVOTHYROXINE SODIUM 88 MCG PO TABS: 88.0000 ug | ORAL_TABLET | Freq: Every day | ORAL | 3 refills | Status: DC

## 2020-03-14 MED ORDER — ZOLPIDEM TARTRATE 10 MG PO TABS: 10.0000 mg | ORAL_TABLET | Freq: Every evening | ORAL | 1 refills | Status: DC | PRN

## 2020-03-14 MED ORDER — PREDNISONE 20 MG PO TABS: 20.0000 mg | ORAL_TABLET | Freq: Two times a day (BID) | ORAL | 0 refills | Status: DC

## 2020-03-14 MED ORDER — LIVALO 2 MG PO TABS: ORAL_TABLET | ORAL | 3 refills | Status: DC

## 2020-03-14 NOTE — Progress Notes (Signed)
Requesting telephone call  Dermatologist tried to change Humira and had joint pain - prednisone helped (back in August of last year)  No flare ups since, but started again this week - same pain in right wrist, thumb, pinky joints - can't hardly use hand   Wondering if she can get prednisone RX

## 2020-03-17 NOTE — Progress Notes (Signed)
Patient ID: Summer Santiago, female   DOB: 06/16/1971, 48 y.o.   MRN: [id number] ..Virtual Visit via Telephone Note  I connected with Summer Santiago on 03/14/2020 at 10:10 AM EDT by telephone and verified that I am speaking with the correct person using two identifiers.  Location: Patient: home Provider: clinic   I discussed the limitations, risks, security and privacy concerns of performing an evaluation and management service by telephone and the availability of in person appointments. I also discussed with the patient that there may be a patient responsible charge related to this service. The patient expressed understanding and agreed to proceed.   History of Present Illness: Patient is a 48-year-old female with psoriasis, hypothyroidism, hyperlipidemia, insomnia who calls into the clinic for referral and medication.  She has been seeing a dermatologist for her psoriasis and her Humira was changed in August of last year.  When her medication was changed she had a flare of joint pain.  She was switched back to Humira.  She has been fine since until this week.  She reports same pain and stiffness and right wrist, right thumb and right pinky joints.  She can hardly use her hand.  She was given prednisone when this happened last.  She continues to take her Humira.  She has never been seen by rheumatology.  She is doing well on her current medications.  She has not had labs in a while.  She does need refills.  She reports no problems with sleeping.  .. Active Ambulatory Problems    Diagnosis Date Noted  . Hypothyroidism 11/26/2013  . Hyperlipidemia 11/26/2013  . Insomnia 11/26/2013  . Vitamin D deficiency 12/27/2013  . Rosacea 11/13/2014  . Pelvic mass in female 02/14/2015  . Uterine fibroid 03/03/2015  . Fibroids 05/09/2015  . Right shoulder pain 11/13/2015  . Psoriasis 03/02/2016  . Elevated BP 03/02/2016  . Breast mass, right 09/12/2017  . Epiploic appendagitis 12/23/2017  . Perioral  dermatitis 06/09/2019   Resolved Ambulatory Problems    Diagnosis Date Noted  . No Resolved Ambulatory Problems   Past Medical History:  Diagnosis Date  . Thyroid disease    Reviewed med, allergy, problem list.     Observations/Objective: No acute distress.  Normal breathing. Acute pain flare.   .. Today's Vitals   03/14/20 1013  Temp: 97.8 F (36.6 C)  TempSrc: Oral  Weight: 175 lb (79.4 kg)  Height: 5' 7" (1.702 m)   Body mass index is 27.41 kg/m.    Assessment and Plan: ..Summer Santiago was seen today for joint pain.  Diagnoses and all orders for this visit:  Arthralgia, unspecified joint -     Lipid Panel w/reflex Direct LDL -     TSH -     COMPLETE METABOLIC PANEL WITH GFR -     predniSONE (DELTASONE) 20 MG tablet; Take 1 tablet (20 mg total) by mouth 2 (two) times daily with a meal. For 5 days. -     Rheumatoid factor -     Cyclic citrul peptide antibody, IgG -     Uric acid -     Sed Rate (ESR) -     C-reactive protein -     Ambulatory referral to Rheumatology  Primary insomnia -     zolpidem (AMBIEN) 10 MG tablet; Take 1 tablet (10 mg total) by mouth at bedtime as needed. for sleep.  Mixed hyperlipidemia -     Pitavastatin Calcium (LIVALO) 2 MG TABS; TAKE 1 TABLET   BY MOUTH  DAILY -     Lipid Panel w/reflex Direct LDL  Acquired hypothyroidism -     levothyroxine (SYNTHROID) 88 MCG tablet; Take 1 tablet (88 mcg total) by mouth daily before breakfast. -     TSH  Psoriasis -     Lipid Panel w/reflex Direct LDL -     TSH -     COMPLETE METABOLIC PANEL WITH GFR -     Rheumatoid factor -     Cyclic citrul peptide antibody, IgG -     Uric acid -     Sed Rate (ESR) -     C-reactive protein -     Ambulatory referral to Rheumatology   Certainly with her history of psoriasis sounds like she could have some psoriatic arthritis and/or rheumatoid arthritis.  Patient is in need for labs for her medication refills.  Labs were ordered today.  I did go ahead and  give her a prednisone burst for her arthralgia.  Referral was placed for rheumatology for further evaluation. Medication refills done.    Follow Up Instructions:    I discussed the assessment and treatment plan with the patient. The patient was provided an opportunity to ask questions and all were answered. The patient agreed with the plan and demonstrated an understanding of the instructions.   The patient was advised to call back or seek an in-person evaluation if the symptoms worsen or if the condition fails to improve as anticipated.  I provided 18 minutes of non-face-to-face time during this encounter.   Jade Breeback, PA-C   

## 2020-04-03 ENCOUNTER — Encounter: Payer: Self-pay | Admitting: Physician Assistant

## 2020-04-03 ENCOUNTER — Other Ambulatory Visit: Payer: Self-pay | Admitting: Nurse Practitioner

## 2020-04-03 DIAGNOSIS — R7989 Other specified abnormal findings of blood chemistry: Secondary | ICD-10-CM

## 2020-04-03 DIAGNOSIS — R7301 Impaired fasting glucose: Secondary | ICD-10-CM

## 2020-04-03 LAB — COMPLETE METABOLIC PANEL WITH GFR
AG Ratio: 1.4 (calc) (ref 1.0–2.5)
ALT: 163 U/L — ABNORMAL HIGH (ref 6–29)
AST: 144 U/L — ABNORMAL HIGH (ref 10–35)
Albumin: 4 g/dL (ref 3.6–5.1)
Alkaline phosphatase (APISO): 82 U/L (ref 31–125)
BUN: 11 mg/dL (ref 7–25)
CO2: 26 mmol/L (ref 20–32)
Calcium: 8.8 mg/dL (ref 8.6–10.2)
Chloride: 104 mmol/L (ref 98–110)
Creat: 0.62 mg/dL (ref 0.50–1.10)
GFR, Est African American: 124 mL/min/{1.73_m2} (ref >=60)
GFR, Est Non African American: 107 mL/min/{1.73_m2} (ref >=60)
Globulin: 2.8 g/dL (calc) (ref 1.9–3.7)
Glucose, Bld: 109 mg/dL — ABNORMAL HIGH (ref 65–99)
Potassium: 4.1 mmol/L (ref 3.5–5.3)
Sodium: 138 mmol/L (ref 135–146)
Total Bilirubin: 0.3 mg/dL (ref 0.2–1.2)
Total Protein: 6.8 g/dL (ref 6.1–8.1)

## 2020-04-03 LAB — LIPID PANEL W/REFLEX DIRECT LDL
Cholesterol: 240 mg/dL — ABNORMAL HIGH (ref <200)
HDL: 51 mg/dL (ref >=50)
LDL Cholesterol (Calc): 158 mg/dL (calc) — ABNORMAL HIGH
Non-HDL Cholesterol (Calc): 189 mg/dL (calc) — ABNORMAL HIGH (ref <130)
Total CHOL/HDL Ratio: 4.7 (calc) (ref <5.0)
Triglycerides: 179 mg/dL — ABNORMAL HIGH (ref <150)

## 2020-04-03 LAB — URIC ACID: Uric Acid, Serum: 5.5 mg/dL (ref 2.5–7.0)

## 2020-04-03 LAB — C-REACTIVE PROTEIN: CRP: 13.8 mg/L — ABNORMAL HIGH (ref <8.0)

## 2020-04-03 LAB — RHEUMATOID FACTOR: Rheumatoid fact SerPl-aCnc: <14 IU/mL (ref <14)

## 2020-04-03 LAB — SEDIMENTATION RATE: Sed Rate: 2 mm/h (ref 0–20)

## 2020-04-03 LAB — CYCLIC CITRUL PEPTIDE ANTIBODY, IGG: Cyclic Citrullin Peptide Ab: <16 UNITS

## 2020-04-03 LAB — TSH: TSH: 3.76 mIU/L

## 2020-05-22 ENCOUNTER — Other Ambulatory Visit: Payer: Self-pay | Admitting: Physician Assistant

## 2020-05-22 DIAGNOSIS — M2550 Pain in unspecified joint: Secondary | ICD-10-CM

## 2020-05-23 ENCOUNTER — Other Ambulatory Visit: Payer: Self-pay | Admitting: Physician Assistant

## 2020-05-23 DIAGNOSIS — M2550 Pain in unspecified joint: Secondary | ICD-10-CM

## 2020-05-27 MED ORDER — CELECOXIB 200 MG PO CAPS: ORAL_CAPSULE | ORAL | 2 refills | Status: DC

## 2020-05-27 NOTE — Addendum Note (Signed)
Addended by: Donella Stade on: 05/27/2020 12:13 PM   Modules accepted: Orders

## 2020-06-13 LAB — BASIC METABOLIC PANEL
CO2: 22 (ref 13–22)
Chloride: 101 (ref 99–108)
Creatinine: 0.7 (ref 0.5–1.1)
Glucose: 108
Potassium: 4.3 (ref 3.4–5.3)
Sodium: 137 (ref 137–147)

## 2020-06-13 LAB — HEPATIC FUNCTION PANEL
ALT: 161 — AB (ref 7–35)
AST: 188 — AB (ref 13–35)
Alkaline Phosphatase: 125 (ref 25–125)
Bilirubin, Total: 0.4

## 2020-06-13 LAB — COMPREHENSIVE METABOLIC PANEL
Albumin: 4.2 (ref 3.5–5.0)
Calcium: 9.3 (ref 8.7–10.7)
GFR calc Af Amer: 98
GFR calc non Af Amer: 113
Globulin: 3.2

## 2020-06-13 LAB — CBC AND DIFFERENTIAL
HCT: 42 (ref 36–46)
Hemoglobin: 14.2 (ref 12.0–16.0)
Neutrophils Absolute: 3
Platelets: 139 — AB (ref 150–399)
WBC: 3.6

## 2020-06-13 LAB — HLA-B27 ANTIGEN: HLA B27: NEGATIVE

## 2020-06-13 LAB — C-REACTIVE PROTEIN: CRP: 23

## 2020-06-13 LAB — HEPATITIS PANEL, ACUTE
Hep A IgM: NEGATIVE
Hep B C IgM: NEGATIVE
Hepatitis B Surface Antigen: NEGATIVE
Hepatitis C Ab: 0.1

## 2020-06-13 LAB — ANA: ANA Ab, IFA: POSITIVE

## 2020-06-13 LAB — CBC: RBC: 4.05 (ref 3.87–5.11)

## 2020-06-13 LAB — HEMOGLOBIN A1C: Hemoglobin A1C: 5.2

## 2020-06-30 LAB — HEPATIC FUNCTION PANEL
ALT: 146 — AB (ref 7–35)
AST: 60 — AB (ref 13–35)

## 2020-06-30 LAB — MITOCHONDRIAL (M2) ANTIBODY: Mitochondrial Antibody Screen: <20

## 2020-06-30 LAB — ACTIN (SMOOTH MUSCLE) ANTIBODY: Actin (Smooth Muscle) Antibody: 6

## 2020-08-05 LAB — HEPATIC FUNCTION PANEL
ALT: 40 — AB (ref 7–35)
AST: 38 — AB (ref 13–35)
Alkaline Phosphatase: 110 (ref 25–125)
Bilirubin, Direct: 0.15 (ref 0.01–0.4)
Bilirubin, Total: 0.5

## 2020-08-05 LAB — PROTEIN, TOTAL: Total Protein: 7.1 (ref 6.4–8.2)

## 2020-08-05 LAB — COMPREHENSIVE METABOLIC PANEL: Albumin: 4.3 (ref 3.5–5.0)

## 2020-09-04 ENCOUNTER — Encounter: Payer: Self-pay | Admitting: Physician Assistant

## 2020-09-29 ENCOUNTER — Other Ambulatory Visit: Payer: Self-pay | Admitting: Physician Assistant

## 2020-09-29 DIAGNOSIS — F5101 Primary insomnia: Secondary | ICD-10-CM

## 2020-09-30 NOTE — Telephone Encounter (Signed)
Last written 03/14/2020 #90 with 1 refill Last appt 03/14/2020 Pended for #30 with note that patient needs appt for further refills

## 2020-10-02 ENCOUNTER — Other Ambulatory Visit: Payer: Self-pay | Admitting: *Deleted

## 2020-10-17 ENCOUNTER — Other Ambulatory Visit: Payer: Self-pay | Admitting: Physician Assistant

## 2020-10-17 DIAGNOSIS — Z1231 Encounter for screening mammogram for malignant neoplasm of breast: Secondary | ICD-10-CM

## 2020-11-20 IMAGING — MG DIGITAL SCREENING BILAT W/ TOMO W/ CAD
8 series · 8 of 24 positions shown · non-contrast
Comparison: Previous exam(s).

CLINICAL DATA: Screening.

EXAM:
DIGITAL SCREENING BILATERAL MAMMOGRAM WITH TOMO AND CAD

[R MLO synth-2D]
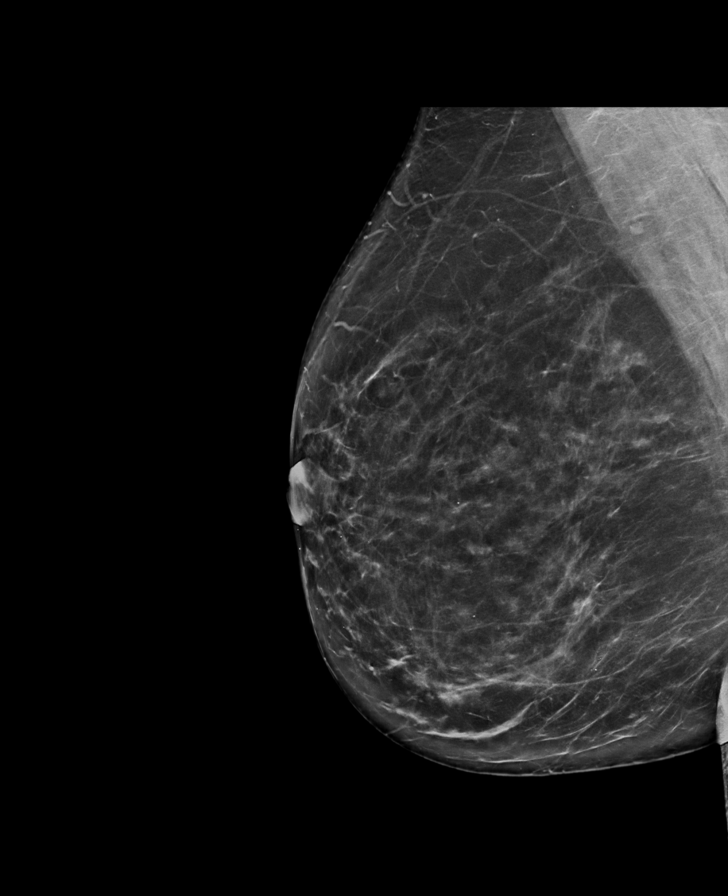

[L CC synth-2D]
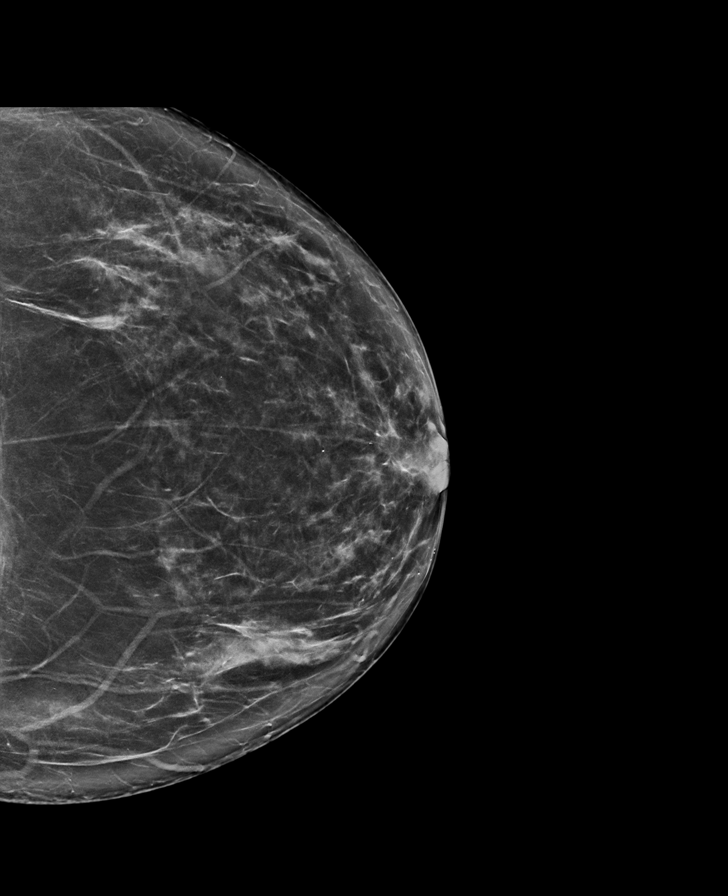

[R CC synth-2D]
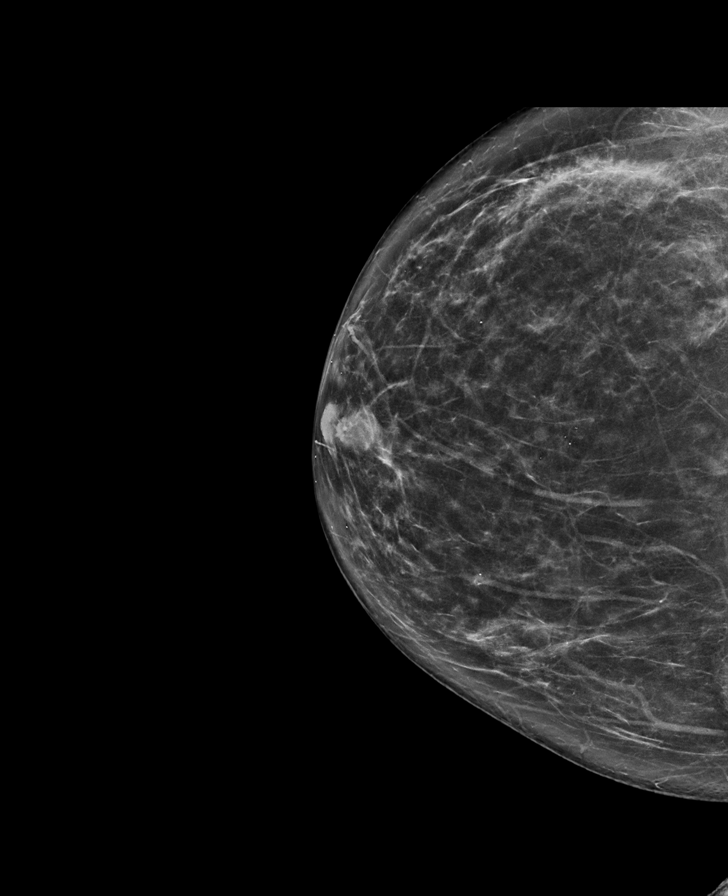

[L MLO synth-2D]
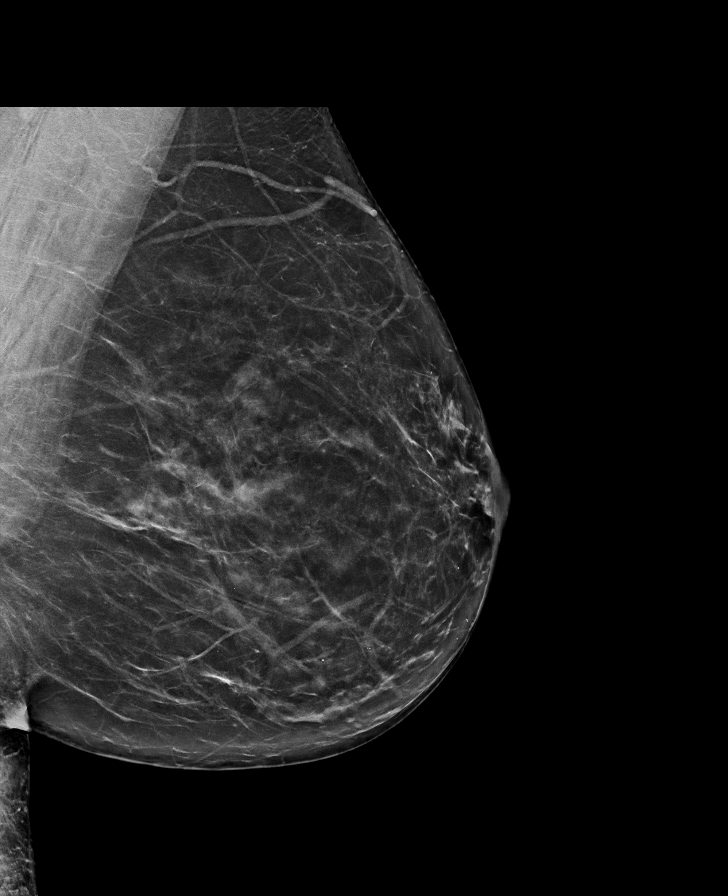

[L CC tomo · tomo slice 39/78.0]
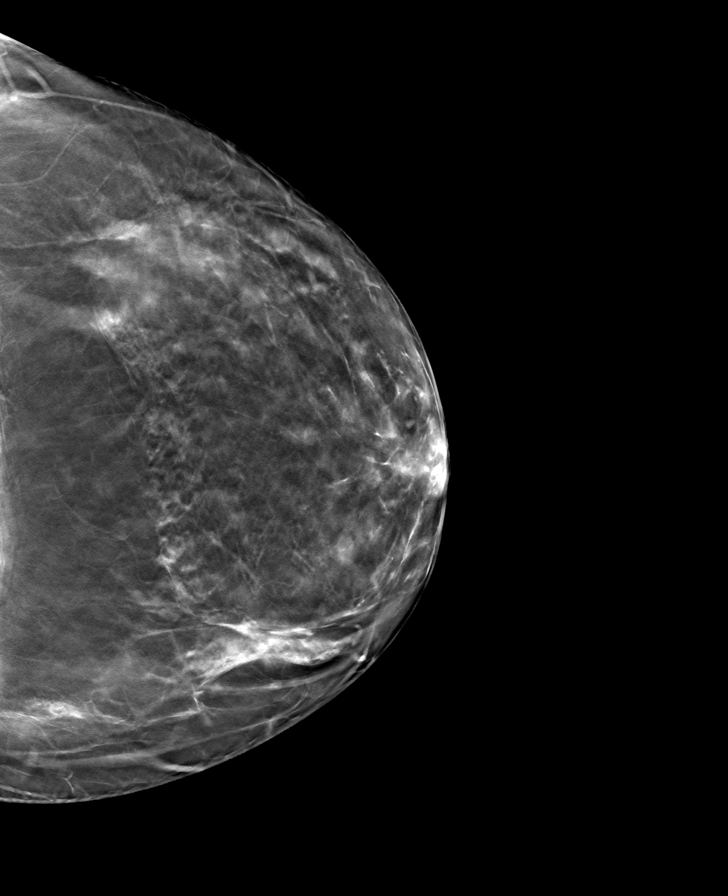

[R MLO tomo · tomo slice 44/87.0]
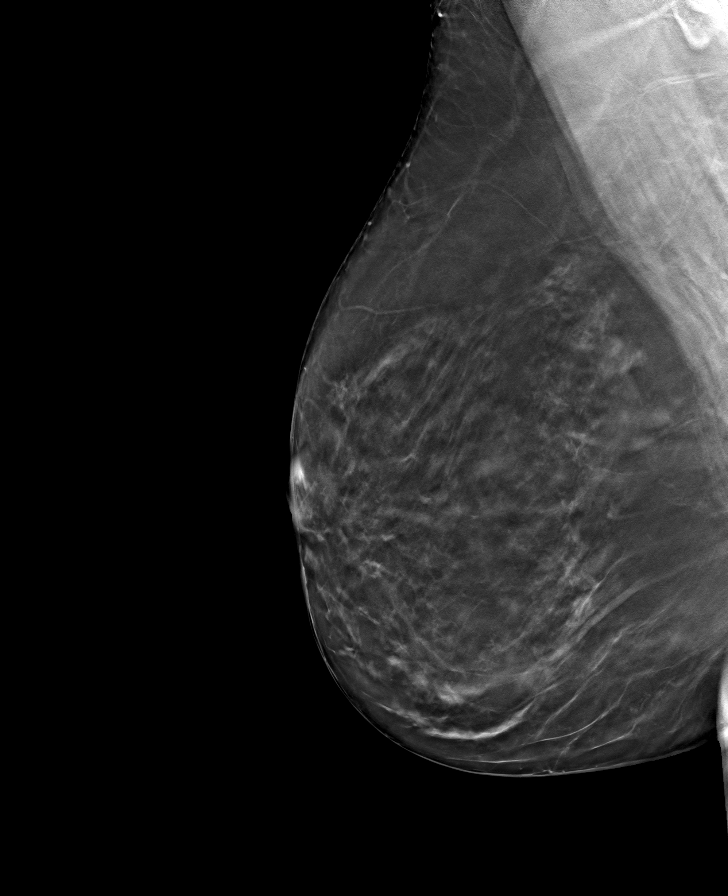

[L MLO tomo · tomo slice 43/84.0]
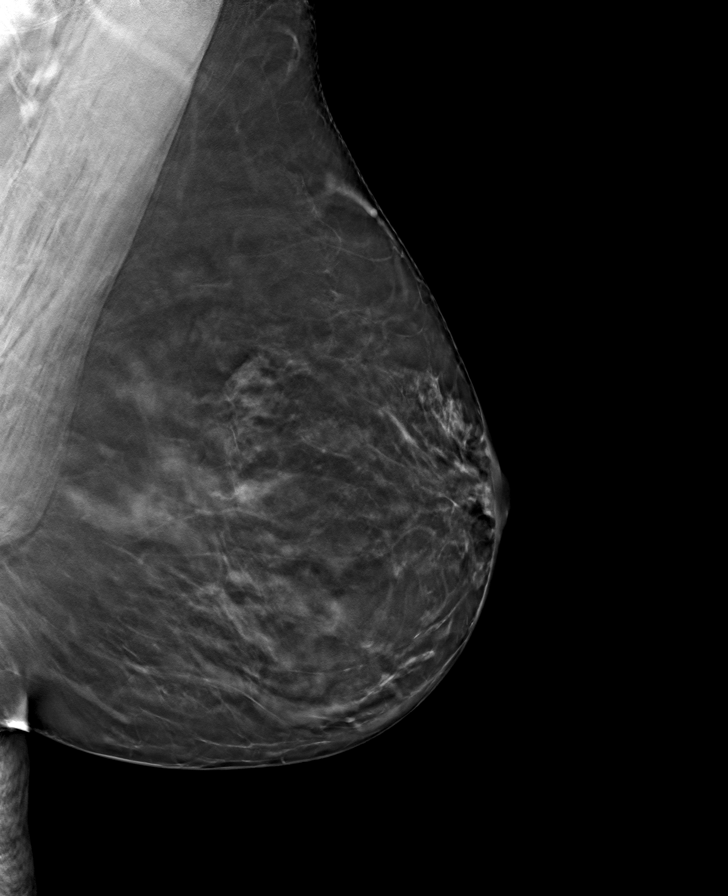

[R CC tomo · tomo slice 38/75.0]
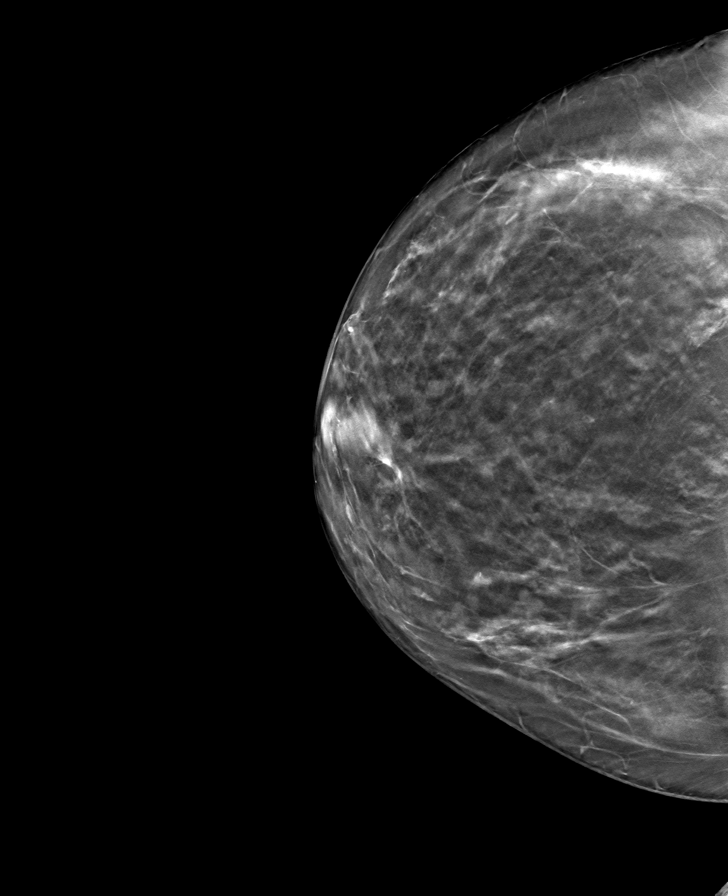

[8 of 24 positions shown; findings below may reference images not displayed]

ACR Breast Density Category b: There are scattered areas of
fibroglandular density.
FINDINGS: There are no findings suspicious for malignancy. Images were
processed with CAD.
IMPRESSION: No mammographic evidence of malignancy. A result letter of this
screening mammogram will be mailed directly to the patient.

RECOMMENDATION:
Screening mammogram in one year. (Code:[id number])

BI-RADS CATEGORY  1: Negative.

## 2020-11-27 ENCOUNTER — Other Ambulatory Visit: Payer: Self-pay

## 2020-11-27 ENCOUNTER — Ambulatory Visit
Admission: RE | Admit: 2020-11-27 | Discharge: 2020-11-27 | Disposition: A | Discharge status: Home / Self Care | Payer: 59 | Source: Ambulatory Visit | Attending: Physician Assistant | Admitting: Physician Assistant

## 2020-11-27 DIAGNOSIS — Z1231 Encounter for screening mammogram for malignant neoplasm of breast: Secondary | ICD-10-CM

## 2020-12-01 NOTE — Progress Notes (Signed)
Normal mammogram. Follow up in 1 year.

## 2020-12-19 ENCOUNTER — Ambulatory Visit (INDEPENDENT_AMBULATORY_CARE_PROVIDER_SITE_OTHER): Payer: 59 | Admitting: Physician Assistant

## 2020-12-19 ENCOUNTER — Other Ambulatory Visit: Payer: Self-pay

## 2020-12-19 ENCOUNTER — Encounter: Payer: Self-pay | Admitting: Physician Assistant

## 2020-12-19 VITALS — BP 135/86 | HR 81 | Wt 178.6 lb

## 2020-12-19 DIAGNOSIS — Z889 Allergy status to unspecified drugs, medicaments and biological substances status: Secondary | ICD-10-CM

## 2020-12-19 DIAGNOSIS — R7989 Other specified abnormal findings of blood chemistry: Secondary | ICD-10-CM

## 2020-12-19 DIAGNOSIS — E782 Mixed hyperlipidemia: Secondary | ICD-10-CM | POA: Diagnosis not present

## 2020-12-19 DIAGNOSIS — T7840XA Allergy, unspecified, initial encounter: Secondary | ICD-10-CM | POA: Insufficient documentation

## 2020-12-19 DIAGNOSIS — F5101 Primary insomnia: Secondary | ICD-10-CM | POA: Diagnosis not present

## 2020-12-19 DIAGNOSIS — E039 Hypothyroidism, unspecified: Secondary | ICD-10-CM

## 2020-12-19 DIAGNOSIS — Z79899 Other long term (current) drug therapy: Secondary | ICD-10-CM

## 2020-12-19 MED ORDER — ZOLPIDEM TARTRATE 10 MG PO TABS: 10.0000 mg | ORAL_TABLET | Freq: Every evening | ORAL | 5 refills | Status: DC | PRN

## 2020-12-19 MED ORDER — EPINEPHRINE 0.3 MG/0.3ML IJ SOAJ: 0.3000 mg | INTRAMUSCULAR | 1 refills | Status: DC | PRN

## 2020-12-19 NOTE — Progress Notes (Signed)
Subjective:    Patient ID: Summer Santiago, female    DOB: 1971/01/11, 50 y.o.   MRN: [id number]  HPI  Pt is a 50 yo female with Psorasis, hypothyroidism and HLD who presents to the clinic for follow up.   She has not been able to take a statin due to increase in liver enzymes. Not on anything.   Her humira was increased to once a week. Doing ok.   She did have allergic reaction with tongue swelling to tylenol.it has happened a few times since even with cold medications. She had some prednisone that she has taken and waited it out. Went to UC once.   .. Active Ambulatory Problems    Diagnosis Date Noted  . Hypothyroidism 11/26/2013  . Hyperlipidemia 11/26/2013  . Insomnia 11/26/2013  . Vitamin D deficiency 12/27/2013  . Rosacea 11/13/2014  . Pelvic mass in female 02/14/2015  . Uterine fibroid 03/03/2015  . Fibroids 05/09/2015  . Right shoulder pain 11/13/2015  . Psoriasis 03/02/2016  . Elevated BP 03/02/2016  . Breast mass, right 09/12/2017  . Epiploic appendagitis 12/23/2017  . Perioral dermatitis 06/09/2019  . Allergic reaction 12/19/2020  . Elevated liver function tests 12/19/2020   Resolved Ambulatory Problems    Diagnosis Date Noted  . No Resolved Ambulatory Problems   Past Medical History:  Diagnosis Date  . Thyroid disease       Review of Systems  All other systems reviewed and are negative.      Objective:   Physical Exam Constitutional:      Appearance: Normal appearance.  HENT:     Head: Normocephalic.  Cardiovascular:     Rate and Rhythm: Normal rate and regular rhythm.     Pulses: Normal pulses.  Pulmonary:     Effort: Pulmonary effort is normal.     Breath sounds: Normal breath sounds.  Musculoskeletal:     Right lower leg: No edema.     Left lower leg: No edema.  Neurological:     General: No focal deficit present.     Mental Status: She is alert and oriented to person, place, and time.  Psychiatric:        Mood and Affect: Mood normal.            Assessment & Plan:  Marland KitchenMarland KitchenDiagnoses and all orders for this visit:  Mixed hyperlipidemia -     Lipid Panel w/reflex Direct LDL -     COMPLETE METABOLIC PANEL WITH GFR -     VITAMIN D 25 Hydroxy (Vit-D Deficiency, Fractures) -     CBC with Differential/Platelet  Primary insomnia -     COMPLETE METABOLIC PANEL WITH GFR -     VITAMIN D 25 Hydroxy (Vit-D Deficiency, Fractures) -     zolpidem (AMBIEN) 10 MG tablet; Take 1 tablet (10 mg total) by mouth at bedtime as needed for sleep. -     CBC with Differential/Platelet  Hypothyroidism, unspecified type -     TSH -     COMPLETE METABOLIC PANEL WITH GFR -     VITAMIN D 25 Hydroxy (Vit-D Deficiency, Fractures) -     CBC with Differential/Platelet  History of allergic reaction -     CBC with Differential/Platelet -     EPINEPHrine 0.3 mg/0.3 mL IJ SOAJ injection; Inject 0.3 mg into the muscle as needed for anaphylaxis.  Elevated liver function tests -     COMPLETE METABOLIC PANEL WITH GFR  Medication management -  VITAMIN D 25 Hydroxy (Vit-D Deficiency, Fractures) -     CBC with Differential/Platelet   Labs ordered for management. Levothyroxine to be adjusted accordingly.  Discussed PSK-9 for cholesterol management.  Recheck liver enzymes.   Discussed allergy to tylenol and on allergy list. Epi pen given to keep on hand. Can take 100mg  of benadryl at onset of allergy symptoms.   ambien for insomia.   Follow up in 6 months.

## 2020-12-19 NOTE — Patient Instructions (Signed)
repatha- PSK  Evolocumab injection What is this medicine? EVOLOCUMAB (e voe LOK ue mab) treats high cholesterol. It is used with lifestyle changes, like diet and exercise. It is used alone or with other medicines. This medicine may be used for other purposes; ask your health care provider or pharmacist if you have questions. COMMON BRAND NAME(S): Repatha What should I tell my health care provider before I take this medicine? They need to know if you have any of these conditions:  an unusual or allergic reaction to evolocumab, other medicines, latex, foods, dyes, or preservatives  pregnant or trying to get pregnant  breast-feeding How should I use this medicine? This medicine is injected under the skin. You will be taught how to prepare and give it. Take it as directed on the prescription label at the same time every day. Keep taking it unless your health care provider tells you to stop. It is important that you put your used needles and syringes in a special sharps container. Do not put them in a trash can. If you do not have a sharps container, call your pharmacist or health care provider to get one. This medicine comes with INSTRUCTIONS FOR USE. Ask your pharmacist for directions on how to use this medicine. Read the information carefully. Talk to your pharmacist or health care provider if you have questions. Talk to your health care provider about the use of this medicine in children. While it may be prescribed for children as young as 10 for selected conditions, precautions do apply. Overdosage: If you think you have taken too much of this medicine contact a poison control center or emergency room at once. NOTE: This medicine is only for you. Do not share this medicine with others. What if I miss a dose? It is important not to miss any doses. Talk to your health care provider about what to do if you miss a dose. What may interact with this medicine? Interactions are not expected. This  list may not describe all possible interactions. Give your health care provider a list of all the medicines, herbs, non-prescription drugs, or dietary supplements you use. Also tell them if you smoke, drink alcohol, or use illegal drugs. Some items may interact with your medicine. What should I watch for while using this medicine? Visit your health care provider for regular checks on your progress. Tell your health care provider if your symptoms do not start to get better or if they get worse. You may need blood work while you are taking this drug. Do not wear the on-body infuser during an MRI. Taking this drug is only part of a total heart healthy program. Your health care provider may give you a special diet to follow. Avoid alcohol. Avoid smoking. Ask your health care provider how much you should exercise. What side effects may I notice from receiving this medicine? Side effects that you should report to your doctor or health care provider as soon as possible:  allergic reactions (skin rash, itching or hives; swelling of the face, lips, or tongue)  high blood sugar (increased hunger, thirst, or urination; unusually weak or tired, blurry vision)  infection (fever, chills, cough, sore throat, pain, or trouble passing urine) Side effects that usually do not require medical attention (report to your doctor or health care provider if they continue or are bothersome):  diarrhea  muscle pain  nausea  pain, redness, or irritation at site where injected This list may not describe all possible side effects. Call  your doctor for medical advice about side effects. You may report side effects to FDA at 1-800-FDA-1088. Where should I keep my medicine? Keep out of the reach of children and pets. Store in a refrigerator or at room temperature between 20 and 25 degrees C (68 and 77 degrees F). Refrigeration (preferred): Store it in the refrigerator. Do not freeze. Keep it in the original carton until  you are ready to take it. Remove the dose from the carton about 30 minutes before it is time for you to take it. Throw away any unused medicine after the expiration date. Room temperature: This medicine may be stored at room temperature for up to 30 days. Keep it in the original carton until you are ready to take it. If it is stored at room temperature, throw away any unused medicine after 30 days or after it expires, whichever is first. Protect from light. Do not shake. Avoid exposure to extreme heat. To get rid of medicines that are no longer needed or have expired:  Take the medicine to a medicine take-back program. Check with your pharmacy or law enforcement to find a location.  If you cannot return the medicine, ask your pharmacist or health care provider how to get rid of this medicine safely. NOTE: This sheet is a summary. It may not cover all possible information. If you have questions about this medicine, talk to your doctor, pharmacist, or health care provider.  2021 Elsevier/Gold Standard (2019-11-14 12:26:14)

## 2020-12-22 ENCOUNTER — Encounter: Payer: Self-pay | Admitting: Physician Assistant

## 2020-12-22 ENCOUNTER — Other Ambulatory Visit: Payer: Self-pay | Admitting: Physician Assistant

## 2020-12-22 DIAGNOSIS — R748 Abnormal levels of other serum enzymes: Secondary | ICD-10-CM

## 2020-12-22 DIAGNOSIS — R7301 Impaired fasting glucose: Secondary | ICD-10-CM | POA: Insufficient documentation

## 2020-12-22 DIAGNOSIS — E785 Hyperlipidemia, unspecified: Secondary | ICD-10-CM

## 2020-12-22 MED ORDER — REPATHA 140 MG/ML ~~LOC~~ SOSY: 140.0000 mg | PREFILLED_SYRINGE | SUBCUTANEOUS | 11 refills | Status: DC

## 2020-12-22 MED ORDER — REPATHA 140 MG/ML ~~LOC~~ SOSY: 140.0000 mg | PREFILLED_SYRINGE | SUBCUTANEOUS | 3 refills | Status: DC

## 2020-12-22 NOTE — Progress Notes (Signed)
Summer Santiago,   When do you follow up with rheumatology. I am concerned about increase in humira potentially causing increase in liver enzymes.

## 2020-12-22 NOTE — Progress Notes (Signed)
Referral placed.

## 2020-12-22 NOTE — Progress Notes (Signed)
Summer Santiago,   Fasting sugar elevated. We will add A1C to evaluate further for diabetes.  Your liver enzymes are even more elevated this is not due to the statin. I would like to send you to GI for full work up.  Vitamin D great.  LDL high, HDL low, TG elevated. I am going to order repatha injection. Likely will need prior authorization .

## 2020-12-23 LAB — CBC WITH DIFFERENTIAL/PLATELET
Absolute Monocytes: 112 cells/uL — ABNORMAL LOW (ref 200–950)
Basophils Absolute: 29 cells/uL (ref 0–200)
Basophils Relative: 0.9 %
Eosinophils Absolute: 10 cells/uL — ABNORMAL LOW (ref 15–500)
Eosinophils Relative: 0.3 %
HCT: 41.7 % (ref 35.0–45.0)
Hemoglobin: 14.8 g/dL (ref 11.7–15.5)
Lymphs Abs: 323 cells/uL — ABNORMAL LOW (ref 850–3900)
MCH: 34.2 pg — ABNORMAL HIGH (ref 27.0–33.0)
MCHC: 35.5 g/dL (ref 32.0–36.0)
MCV: 96.3 fL (ref 80.0–100.0)
MPV: 11.5 fL (ref 7.5–12.5)
Monocytes Relative: 3.5 %
Neutro Abs: 2726 cells/uL (ref 1500–7800)
Neutrophils Relative %: 85.2 %
Platelets: 129 10*3/uL — ABNORMAL LOW (ref 140–400)
RBC: 4.33 10*6/uL (ref 3.80–5.10)
RDW: 12.4 % (ref 11.0–15.0)
Total Lymphocyte: 10.1 %
WBC: 3.2 10*3/uL — ABNORMAL LOW (ref 3.8–10.8)

## 2020-12-23 LAB — COMPLETE METABOLIC PANEL WITH GFR
AG Ratio: 1 (calc) (ref 1.0–2.5)
ALT: 278 U/L — ABNORMAL HIGH (ref 6–29)
AST: 236 U/L — ABNORMAL HIGH (ref 10–35)
Albumin: 3.6 g/dL (ref 3.6–5.1)
Alkaline phosphatase (APISO): 115 U/L (ref 31–125)
BUN: 7 mg/dL (ref 7–25)
CO2: 22 mmol/L (ref 20–32)
Calcium: 9 mg/dL (ref 8.6–10.2)
Chloride: 105 mmol/L (ref 98–110)
Creat: 0.54 mg/dL (ref 0.50–1.10)
GFR, Est African American: 129 mL/min/{1.73_m2} (ref >=60)
GFR, Est Non African American: 112 mL/min/{1.73_m2} (ref >=60)
Globulin: 3.5 g/dL (calc) (ref 1.9–3.7)
Glucose, Bld: 114 mg/dL — ABNORMAL HIGH (ref 65–99)
Potassium: 4.3 mmol/L (ref 3.5–5.3)
Sodium: 137 mmol/L (ref 135–146)
Total Bilirubin: 0.7 mg/dL (ref 0.2–1.2)
Total Protein: 7.1 g/dL (ref 6.1–8.1)

## 2020-12-23 LAB — TSH: TSH: 3.48 mIU/L

## 2020-12-23 LAB — LIPID PANEL W/REFLEX DIRECT LDL
Cholesterol: 263 mg/dL — ABNORMAL HIGH (ref <200)
HDL: 40 mg/dL — ABNORMAL LOW (ref >=50)
LDL Cholesterol (Calc): 185 mg/dL (calc) — ABNORMAL HIGH
Non-HDL Cholesterol (Calc): 223 mg/dL (calc) — ABNORMAL HIGH (ref <130)
Total CHOL/HDL Ratio: 6.6 (calc) — ABNORMAL HIGH (ref <5.0)
Triglycerides: 200 mg/dL — ABNORMAL HIGH (ref <150)

## 2020-12-23 LAB — VITAMIN D 25 HYDROXY (VIT D DEFICIENCY, FRACTURES): Vit D, 25-Hydroxy: 65 ng/mL (ref 30–100)

## 2020-12-23 LAB — TEST AUTHORIZATION

## 2020-12-23 LAB — HEMOGLOBIN A1C W/OUT EAG: Hgb A1c MFr Bld: 5.9 % of total Hgb — ABNORMAL HIGH (ref <5.7)

## 2020-12-23 NOTE — Progress Notes (Signed)
S9H is certainly up from last check but in pre-diabetes range. Recheck in 6 months. Avoid sugars/carbs and get regular exercise.

## 2021-01-12 ENCOUNTER — Telehealth (INDEPENDENT_AMBULATORY_CARE_PROVIDER_SITE_OTHER): Payer: 59 | Admitting: Physician Assistant

## 2021-01-12 VITALS — Ht 67.0 in | Wt 178.0 lb

## 2021-01-12 DIAGNOSIS — E785 Hyperlipidemia, unspecified: Secondary | ICD-10-CM

## 2021-01-12 DIAGNOSIS — J0100 Acute maxillary sinusitis, unspecified: Secondary | ICD-10-CM | POA: Diagnosis not present

## 2021-01-12 MED ORDER — FLUTICASONE PROPIONATE 50 MCG/ACT NA SUSP: 2.0000 | Freq: Every day | NASAL | 1 refills | Status: DC

## 2021-01-12 MED ORDER — AMOXICILLIN-POT CLAVULANATE 875-125 MG PO TABS: 1.0000 | ORAL_TABLET | Freq: Two times a day (BID) | ORAL | 0 refills | Status: DC

## 2021-01-12 NOTE — Progress Notes (Signed)
Patient ID: Summer Santiago, female   DOB: 02-23-1971, 50 y.o.   MRN: [id number] .Virtual Visit via Telephone Note  I connected with ALYANNAH SANKS on 01/12/21 at  2:40 PM EST by telephone and verified that I am speaking with the correct person using two identifiers.  Location: Patient: home Provider: clinic  .Marland KitchenParticipating in visit:  Patient: Shelaine Frie Provider: Iran Planas PA-C   I discussed the limitations, risks, security and privacy concerns of performing an evaluation and management service by telephone and the availability of in person appointments. I also discussed with the patient that there may be a patient responsible charge related to this service. The patient expressed understanding and agreed to proceed.   History of Present Illness: Patient is a 50 year old female who calls into the clinic to discuss 1 month of postnasal drip, sinus pressure, headache, swollen lymph nodes, throat clearing to productive cough.  She is used over-the-counter treatment with Mucinex D, Tylenol, Delsym with little relief.  She feels like her symptoms are getting worse.  She denies any shortness of breath, fever, body aches, loss of smell and taste.   repatha still has not been approved. Calling to check status.  .. Active Ambulatory Problems    Diagnosis Date Noted  . Hypothyroidism 11/26/2013  . Dyslipidemia (high LDL; low HDL) 11/26/2013  . Insomnia 11/26/2013  . Vitamin D deficiency 12/27/2013  . Rosacea 11/13/2014  . Pelvic mass in female 02/14/2015  . Uterine fibroid 03/03/2015  . Fibroids 05/09/2015  . Right shoulder pain 11/13/2015  . Psoriasis 03/02/2016  . Elevated BP 03/02/2016  . Breast mass, right 09/12/2017  . Epiploic appendagitis 12/23/2017  . Perioral dermatitis 06/09/2019  . Allergic reaction 12/19/2020  . Elevated liver function tests 12/19/2020  . Elevated liver enzymes 12/22/2020  . Elevated fasting blood sugar 12/22/2020   Resolved Ambulatory Problems     Diagnosis Date Noted  . No Resolved Ambulatory Problems   Past Medical History:  Diagnosis Date  . Hyperlipidemia   . Thyroid disease    Reviewed med, allergy, problem list.     Observations/Objective: No acute distress Normal breathing Tenderness over maxillary sinuses Throat clearing.   .. Today's Vitals   01/12/21 1408  Weight: 178 lb (80.7 kg)  Height: 5\' 7"  (1.702 m)   Body mass index is 27.88 kg/m.    Assessment and Plan: Marland KitchenMarland KitchenDayzha was seen today for cough.  Diagnoses and all orders for this visit:  Acute non-recurrent maxillary sinusitis -     amoxicillin-clavulanate (AUGMENTIN) 875-125 MG tablet; Take 1 tablet by mouth 2 (two) times daily. -     fluticasone (FLONASE) 50 MCG/ACT nasal spray; Place 2 sprays into both nostrils daily.  Dyslipidemia (high LDL; low HDL)   Will send labs with LDL of 185 to insurance company. She failed lipitor and zocor due to muscle aches.   Treated with augmentin and flonase for sinusitis.     Follow Up Instructions:    I discussed the assessment and treatment plan with the patient. The patient was provided an opportunity to ask questions and all were answered. The patient agreed with the plan and demonstrated an understanding of the instructions.   The patient was advised to call back or seek an in-person evaluation if the symptoms worsen or if the condition fails to improve as anticipated.  I provided 15 minutes of non-face-to-face time during this encounter.   Iran Planas, PA-C

## 2021-01-12 NOTE — Progress Notes (Deleted)
Symptoms started approx 1 month ago, getting progressively worse Post nasal drip - getting worse Glands in neck swollen (worse on left) Productive cough  Taking mucinex OTC some  No other OTC meds

## 2021-01-13 ENCOUNTER — Telehealth: Payer: 59 | Admitting: Physician Assistant

## 2021-01-14 ENCOUNTER — Telehealth: Payer: Self-pay | Admitting: Neurology

## 2021-01-14 NOTE — Telephone Encounter (Signed)
Repatha injections received denial on covermymeds.   Resubmitted with lab values and that patient failed Lipitor and Zocor. Awaiting to hear back on authorization.

## 2021-01-16 NOTE — Telephone Encounter (Signed)
I sent medical records and started new PA and still denied.   The denial was based on our criteria for Repatha Inj 140mg /Ml. Per your health plan's criteria, this drug is covered if you meet the following: Medical records are sent in (for example: laboratory values) with the following cholesterol values [lowdensity lipoprotein cholesterol (LDL-C)] while on maximally tolerated cholesterol-lowering drug in the last 120 days: LDL-C is at least 100mg /dL without the cholesterol disorder (atherosclerotic cardiovascular disease - plaque builds up inside the arteries of your heart). The information provided does not show that you meet the criteria listed above.

## 2021-01-19 NOTE — Telephone Encounter (Signed)
Let the patient know.

## 2021-01-20 NOTE — Telephone Encounter (Signed)
Tried to call patient and call would not connect.

## 2021-01-21 NOTE — Telephone Encounter (Signed)
Patient made aware.  She wants to know next steps.  I told her she could appeal the decision and we would be happy to support that in whatever way she needs.  She states her friend's husband is in some type of research program to be on medication, she will get that information and if she needs referral from [place] we could provide that. She will keep [place] updated.

## 2021-02-04 ENCOUNTER — Other Ambulatory Visit: Payer: Self-pay | Admitting: Physician Assistant

## 2021-02-04 DIAGNOSIS — J0100 Acute maxillary sinusitis, unspecified: Secondary | ICD-10-CM

## 2021-02-19 NOTE — Telephone Encounter (Signed)
Prior Authorization for Repatha resubmitted via covermymeds. Awaiting response.

## 2021-02-20 ENCOUNTER — Telehealth: Payer: Self-pay

## 2021-02-20 ENCOUNTER — Other Ambulatory Visit: Payer: Self-pay | Admitting: Physician Assistant

## 2021-02-20 DIAGNOSIS — F5101 Primary insomnia: Secondary | ICD-10-CM

## 2021-02-20 DIAGNOSIS — E039 Hypothyroidism, unspecified: Secondary | ICD-10-CM

## 2021-02-20 NOTE — Telephone Encounter (Signed)
PA sent for Evolocumab (REPATHA) 140 MG/ML SOSY

## 2021-02-23 NOTE — Telephone Encounter (Signed)
PA denied by insurance, they state patient doesn't meet criteria for coverage.

## 2021-02-23 NOTE — Telephone Encounter (Signed)
Last seen 12/19/2020 (01/12/2021 sick visit) Last written 12/19/2020 to [facility], requesting at mail order pharmacy.

## 2021-02-24 ENCOUNTER — Encounter: Payer: Self-pay | Admitting: Physician Assistant

## 2021-04-23 ENCOUNTER — Other Ambulatory Visit: Payer: Self-pay | Admitting: Physician Assistant

## 2021-05-22 ENCOUNTER — Other Ambulatory Visit: Payer: Self-pay | Admitting: Physician Assistant

## 2021-07-03 ENCOUNTER — Other Ambulatory Visit: Payer: Self-pay | Admitting: Rheumatology

## 2021-07-03 DIAGNOSIS — R7989 Other specified abnormal findings of blood chemistry: Secondary | ICD-10-CM

## 2021-07-22 ENCOUNTER — Ambulatory Visit
Admission: RE | Admit: 2021-07-22 | Discharge: 2021-07-22 | Disposition: A | Discharge status: Home / Self Care | Payer: 59 | Source: Ambulatory Visit | Attending: Rheumatology | Admitting: Rheumatology

## 2021-07-22 ENCOUNTER — Other Ambulatory Visit: Payer: Self-pay

## 2021-07-22 DIAGNOSIS — R7989 Other specified abnormal findings of blood chemistry: Secondary | ICD-10-CM

## 2021-07-23 ENCOUNTER — Other Ambulatory Visit: Payer: Self-pay | Admitting: Physician Assistant

## 2021-09-13 ENCOUNTER — Other Ambulatory Visit: Payer: Self-pay | Admitting: Physician Assistant

## 2021-09-13 DIAGNOSIS — F5101 Primary insomnia: Secondary | ICD-10-CM

## 2021-09-13 DIAGNOSIS — E782 Mixed hyperlipidemia: Secondary | ICD-10-CM

## 2021-09-14 NOTE — Telephone Encounter (Signed)
Last written 02/23/2021 - 6 month supply Last appt 01/12/2021 (acute issue), regular visit 12/19/2020 Pended for 30 day supply with note that patient needs appt for further refills

## 2021-09-29 ENCOUNTER — Other Ambulatory Visit: Payer: Self-pay | Admitting: Physician Assistant

## 2021-10-28 ENCOUNTER — Other Ambulatory Visit: Payer: Self-pay

## 2021-10-28 ENCOUNTER — Ambulatory Visit (INDEPENDENT_AMBULATORY_CARE_PROVIDER_SITE_OTHER): Payer: 59

## 2021-10-28 ENCOUNTER — Encounter: Payer: Self-pay | Admitting: Physician Assistant

## 2021-10-28 ENCOUNTER — Ambulatory Visit (INDEPENDENT_AMBULATORY_CARE_PROVIDER_SITE_OTHER): Payer: 59 | Admitting: Physician Assistant

## 2021-10-28 VITALS — BP 130/75 | HR 78 | Temp 98.5°F | Ht 67.0 in | Wt 179.0 lb

## 2021-10-28 DIAGNOSIS — Z23 Encounter for immunization: Secondary | ICD-10-CM

## 2021-10-28 DIAGNOSIS — R0781 Pleurodynia: Secondary | ICD-10-CM

## 2021-10-28 DIAGNOSIS — E782 Mixed hyperlipidemia: Secondary | ICD-10-CM

## 2021-10-28 DIAGNOSIS — L4050 Arthropathic psoriasis, unspecified: Secondary | ICD-10-CM

## 2021-10-28 DIAGNOSIS — F5101 Primary insomnia: Secondary | ICD-10-CM

## 2021-10-28 DIAGNOSIS — Z1211 Encounter for screening for malignant neoplasm of colon: Secondary | ICD-10-CM

## 2021-10-28 MED ORDER — GABAPENTIN 300 MG PO CAPS: 300.0000 mg | ORAL_CAPSULE | Freq: Three times a day (TID) | ORAL | 1 refills | Status: DC

## 2021-10-28 MED ORDER — LIVALO 2 MG PO TABS: 1.0000 | ORAL_TABLET | Freq: Every day | ORAL | 0 refills | Status: DC

## 2021-10-28 MED ORDER — ZOLPIDEM TARTRATE 10 MG PO TABS: 10.0000 mg | ORAL_TABLET | Freq: Every evening | ORAL | 1 refills | Status: DC | PRN

## 2021-10-28 NOTE — Patient Instructions (Signed)
Recombinant Zoster (Shingles) Vaccine: What You Need to Know °1. Why get vaccinated? °Recombinant zoster (shingles) vaccine can prevent shingles. °Shingles (also called herpes zoster, or just zoster) is a painful skin rash, usually with blisters. In addition to the rash, shingles can cause fever, headache, chills, or upset stomach. Rarely, shingles can lead to complications such as pneumonia, hearing problems, blindness, brain inflammation (encephalitis), or death. °The risk of shingles increases with age. The most common complication of shingles is long-term nerve pain called postherpetic neuralgia (PHN). PHN occurs in the areas where the shingles rash was and can last for months or years after the rash goes away. The pain from PHN can be severe and debilitating. °The risk of PHN increases with age. An older adult with shingles is more likely to develop PHN and have longer lasting and more severe pain than a younger person. °People with weakened immune systems also have a higher risk of getting shingles and complications from the disease. °Shingles is caused by varicella-zoster virus, the same virus that causes chickenpox. After you have chickenpox, the virus stays in your body and can cause shingles later in life. Shingles cannot be passed from one person to another, but the virus that causes shingles can spread and cause chickenpox in someone who has never had chickenpox or has never received chickenpox vaccine. °2. Recombinant shingles vaccine °Recombinant shingles vaccine provides strong protection against shingles. By preventing shingles, recombinant shingles vaccine also protects against PHN and other complications. °Recombinant shingles vaccine is recommended for: °Adults 50 years and older °Adults 19 years and older who have a weakened immune system because of disease or treatments °Shingles vaccine is given as a two-dose series. For most people, the second dose should be given 2 to 6 months after the first  dose. Some people who have or will have a weakened immune system can get the second dose 1 to 2 months after the first dose. Ask your health care provider for guidance. °People who have had shingles in the past and people who have received varicella (chickenpox) vaccine are recommended to get recombinant shingles vaccine. The vaccine is also recommended for people who have already gotten another type of shingles vaccine, the live shingles vaccine. There is no live virus in recombinant shingles vaccine. °Shingles vaccine may be given at the same time as other vaccines. °3. Talk with your health care provider °Tell your vaccination provider if the person getting the vaccine: °Has had an allergic reaction after a previous dose of recombinant shingles vaccine, or has any severe, life-threatening allergies °Is currently experiencing an episode of shingles °Is pregnant °In some cases, your health care provider may decide to postpone shingles vaccination until a future visit. °People with minor illnesses, such as a cold, may be vaccinated. People who are moderately or severely ill should usually wait until they recover before getting recombinant shingles vaccine. °Your health care provider can give you more information. °4. Risks of a vaccine reaction °A sore arm with mild or moderate pain is very common after recombinant shingles vaccine. Redness and swelling can also happen at the site of the injection. °Tiredness, muscle pain, headache, shivering, fever, stomach pain, and nausea are common after recombinant shingles vaccine. °These side effects may temporarily prevent a vaccinated person from doing regular activities. Symptoms usually go away on their own in 2 to 3 days. You should still get the second dose of recombinant shingles vaccine even if you had one of these reactions after the first dose. °Guillain-Barré   syndrome (GBS), a serious nervous system disorder, has been reported very rarely after recombinant zoster  vaccine. °People sometimes faint after medical procedures, including vaccination. Tell your provider if you feel dizzy or have vision changes or ringing in the ears. °As with any medicine, there is a very remote chance of a vaccine causing a severe allergic reaction, other serious injury, or death. °5. What if there is a serious problem? °An allergic reaction could occur after the vaccinated person leaves the clinic. If you see signs of a severe allergic reaction (hives, swelling of the face and throat, difficulty breathing, a fast heartbeat, dizziness, or weakness), call 9-1-1 and get the person to the nearest hospital. °For other signs that concern you, call your health care provider. °Adverse reactions should be reported to the Vaccine Adverse Event Reporting System (VAERS). Your health care provider will usually file this report, or you can do it yourself. Visit the VAERS website at www.vaers.hhs.gov or call 1-800-822-7967. VAERS is only for reporting reactions, and VAERS staff members do not give medical advice. °6. How can I learn more? °Ask your health care provider. °Call your local or state health department. °Visit the website of the Food and Drug Administration (FDA) for vaccine package inserts and additional information at www.fda.gov/vaccinesblood-biologics/vaccines. °Contact the Centers for Disease Control and Prevention (CDC): °Call 1-800-232-4636 (1-800-CDC-INFO) or °Visit CDC's website at www.cdc.gov/vaccines. °Vaccine Information Statement Recombinant Zoster Vaccine (01/09/2021) °This information is not intended to replace advice given to you by your health care provider. Make sure you discuss any questions you have with your health care provider. °Document Revised: 08/07/2021 Document Reviewed: 01/23/2021 °Elsevier Patient Education © 2022 [employer]. ° °

## 2021-10-28 NOTE — Progress Notes (Signed)
Subjective:    Patient ID: Summer Santiago, female    DOB: 10-Jun-1971, 50 y.o.   MRN: [id number]  HPI Pt is a 50 yo female with psoriatic arthritis, hypothyroidism, HLD who presents to the clinic for follow up and medication refills.   Pt is sleeping well and needs ambien refills. No concerns.   Thyroid is doing well. Taking synthroid with no concerns.   Pt sees rheumatology for psoriatic arthritis management. Due to her medications she is consider immunosuppressed. She would like shingles shot early.   She is having new intermittent upper left side into lower ribs sharp pains that will last intensely for 30 minutes to 1 hour but then take 1 day to resolve. She will not have them for days and then suddenly have the pain again. No injury to ribs. No cough, SOB, nausea, vomiting, melena, hematochezia.   Marland Kitchen. Active Ambulatory Problems    Diagnosis Date Noted   Hypothyroidism 11/26/2013   Dyslipidemia (high LDL; low HDL) 11/26/2013   Insomnia 11/26/2013   Vitamin D deficiency 12/27/2013   Rosacea 11/13/2014   Pelvic mass in female 02/14/2015   Uterine fibroid 03/03/2015   Fibroids 05/09/2015   Right shoulder pain 11/13/2015   Psoriasis 03/02/2016   Elevated BP 03/02/2016   Breast mass, right 09/12/2017   Epiploic appendagitis 12/23/2017   Perioral dermatitis 06/09/2019   Allergic reaction 12/19/2020   Elevated liver function tests 12/19/2020   Elevated liver enzymes 12/22/2020   Elevated fasting blood sugar 12/22/2020   Psoriatic arthritis ([place]) 11/03/2021   Resolved Ambulatory Problems    Diagnosis Date Noted   No Resolved Ambulatory Problems   Past Medical History:  Diagnosis Date   Hyperlipidemia    Thyroid disease       Review of Systems    See HPI.  Objective:   Physical Exam Vitals reviewed.  Constitutional:      Appearance: Normal appearance. She is obese.  HENT:     Head: Normocephalic.  Cardiovascular:     Rate and Rhythm: Normal rate and regular  rhythm.     Pulses: Normal pulses.  Pulmonary:     Effort: Pulmonary effort is normal.     Breath sounds: Normal breath sounds.  Abdominal:     General: Bowel sounds are normal. There is no distension.     Palpations: There is no mass.     Tenderness: There is no abdominal tenderness. There is no right CVA tenderness, left CVA tenderness or guarding.     Hernia: No hernia is present.  Neurological:     General: No focal deficit present.     Mental Status: She is alert and oriented to person, place, and time.  Psychiatric:        Mood and Affect: Mood normal.          Assessment & Plan:  Marland KitchenMarland KitchenTsuruko was seen today for flank pain.  Diagnoses and all orders for this visit:  Rib pain on left side -     DG Ribs Unilateral W/Chest Left; Future -     gabapentin (NEURONTIN) 300 MG capsule; Take 1 capsule (300 mg total) by mouth 3 (three) times daily.  Primary insomnia -     zolpidem (AMBIEN) 10 MG tablet; Take 1 tablet (10 mg total) by mouth at bedtime as needed for sleep.  Mixed hyperlipidemia -     Pitavastatin Calcium (LIVALO) 2 MG TABS; Take 1 tablet (2 mg total) by mouth daily.  Need for shingles vaccine -  Varicella-zoster vaccine IM  Psoriatic arthritis ([place])  Colon cancer screening -     Ambulatory referral to Gastroenterology  Shingrix started today.  Needs colonoscopy.  Fasting labs ordered.  Refilled ambien for 6 months.   Unclear etiology of intermittent left rib pain.  Xray normal.  Likely diaphragmatic spasms.  Consider gentle massage.  Refilled gabapentin for as needed.  If continues consider more imaging and GI work up.  No red flag signs or symptoms.

## 2021-11-02 NOTE — Progress Notes (Signed)
Summer Santiago,   No evidence of fracture, mass with ribs and chest/lungs look great too.

## 2021-11-03 DIAGNOSIS — L4050 Arthropathic psoriasis, unspecified: Secondary | ICD-10-CM | POA: Insufficient documentation

## 2021-11-13 ENCOUNTER — Other Ambulatory Visit: Payer: Self-pay | Admitting: Physician Assistant

## 2021-11-13 DIAGNOSIS — Z1231 Encounter for screening mammogram for malignant neoplasm of breast: Secondary | ICD-10-CM

## 2021-12-14 ENCOUNTER — Other Ambulatory Visit: Payer: Self-pay | Admitting: Physician Assistant

## 2021-12-14 DIAGNOSIS — R0781 Pleurodynia: Secondary | ICD-10-CM

## 2021-12-18 ENCOUNTER — Ambulatory Visit
Admission: RE | Admit: 2021-12-18 | Discharge: 2021-12-18 | Disposition: A | Discharge status: Home / Self Care | Payer: 59 | Source: Ambulatory Visit | Attending: Physician Assistant | Admitting: Physician Assistant

## 2021-12-18 DIAGNOSIS — Z1231 Encounter for screening mammogram for malignant neoplasm of breast: Secondary | ICD-10-CM

## 2021-12-18 NOTE — Progress Notes (Signed)
Normal mammogram. Follow up in 1 year.

## 2022-01-15 ENCOUNTER — Ambulatory Visit (INDEPENDENT_AMBULATORY_CARE_PROVIDER_SITE_OTHER): Payer: 59 | Admitting: Physician Assistant

## 2022-01-15 ENCOUNTER — Other Ambulatory Visit: Payer: Self-pay

## 2022-01-15 ENCOUNTER — Ambulatory Visit: Payer: 59

## 2022-01-15 VITALS — BP 148/81 | HR 66 | Temp 97.5°F | Ht 67.0 in | Wt 179.0 lb

## 2022-01-15 DIAGNOSIS — Z23 Encounter for immunization: Secondary | ICD-10-CM | POA: Diagnosis not present

## 2022-01-15 NOTE — Progress Notes (Signed)
Agree with above plan. 

## 2022-01-15 NOTE — Progress Notes (Signed)
Patient is here for 2nd Shingles vaccine. Denies chest pain, shortness of breath, headaches and problems with medication or mood changes.  Patient tolerated injection well without complications.  No further visits required.  

## 2022-01-21 ENCOUNTER — Other Ambulatory Visit: Payer: Self-pay | Admitting: Physician Assistant

## 2022-02-03 ENCOUNTER — Other Ambulatory Visit: Payer: Self-pay | Admitting: Physician Assistant

## 2022-02-03 DIAGNOSIS — E782 Mixed hyperlipidemia: Secondary | ICD-10-CM

## 2022-03-07 ENCOUNTER — Other Ambulatory Visit: Payer: Self-pay | Admitting: Physician Assistant

## 2022-03-07 DIAGNOSIS — E039 Hypothyroidism, unspecified: Secondary | ICD-10-CM

## 2022-04-10 ENCOUNTER — Other Ambulatory Visit: Payer: Self-pay

## 2022-04-10 ENCOUNTER — Emergency Department (INDEPENDENT_AMBULATORY_CARE_PROVIDER_SITE_OTHER)
Admission: EM | Admit: 2022-04-10 | Discharge: 2022-04-10 | Disposition: A | Discharge status: Home / Self Care | Payer: 59 | Source: Home / Self Care

## 2022-04-10 DIAGNOSIS — J0100 Acute maxillary sinusitis, unspecified: Secondary | ICD-10-CM | POA: Diagnosis not present

## 2022-04-10 DIAGNOSIS — J309 Allergic rhinitis, unspecified: Secondary | ICD-10-CM

## 2022-04-10 DIAGNOSIS — R059 Cough, unspecified: Secondary | ICD-10-CM

## 2022-04-10 MED ORDER — PREDNISONE 20 MG PO TABS: ORAL_TABLET | ORAL | 0 refills | Status: DC

## 2022-04-10 MED ORDER — AMOXICILLIN-POT CLAVULANATE 875-125 MG PO TABS: 1.0000 | ORAL_TABLET | Freq: Two times a day (BID) | ORAL | 0 refills | Status: AC

## 2022-04-10 MED ORDER — BENZONATATE 200 MG PO CAPS: 200.0000 mg | ORAL_CAPSULE | Freq: Three times a day (TID) | ORAL | 0 refills | Status: AC | PRN

## 2022-04-10 MED ORDER — FEXOFENADINE HCL 180 MG PO TABS: 180.0000 mg | ORAL_TABLET | Freq: Every day | ORAL | 0 refills | Status: AC

## 2022-04-10 NOTE — ED Provider Notes (Signed)
?[facility] ? ? ? ?CSN: [id number] ?Arrival date & time: 04/10/22  1113 ? ? ?  ? ?History   ?Chief Complaint ?Chief Complaint  ?Patient presents with  ? Cough  ?  Cough, chest congestion, and loss of voice. X3 days  ? ? ?HPI ?Summer Santiago is a 51 y.o. female.  ? ?HPI Pleasant 51 year old female presents with cough, chest congestion, and voice loss x 4-5 days.  PMH significant for elevated blood pressure, HLD and hypothyroidism. ? ?Past Medical History:  ?Diagnosis Date  ? Hyperlipidemia   ? Hypothyroidism   ? Thyroid disease   ? ? ?Patient Active Problem List  ? Diagnosis Date Noted  ? Psoriatic arthritis ([place]) 11/03/2021  ? Elevated liver enzymes 12/22/2020  ? Elevated fasting blood sugar 12/22/2020  ? Allergic reaction 12/19/2020  ? Elevated liver function tests 12/19/2020  ? Perioral dermatitis 06/09/2019  ? Epiploic appendagitis 12/23/2017  ? Breast mass, right 09/12/2017  ? Psoriasis 03/02/2016  ? Elevated BP 03/02/2016  ? Right shoulder pain 11/13/2015  ? Fibroids 05/09/2015  ? Uterine fibroid 03/03/2015  ? Pelvic mass in female 02/14/2015  ? Rosacea 11/13/2014  ? Vitamin D deficiency 12/27/2013  ? Hypothyroidism 11/26/2013  ? Dyslipidemia (high LDL; low HDL) 11/26/2013  ? Insomnia 11/26/2013  ? ? ?Past Surgical History:  ?Procedure Laterality Date  ? ANTERIOR CRUCIATE LIGAMENT REPAIR    ? right  ? APPENDECTOMY    ? BILATERAL SALPINGECTOMY Bilateral 05/09/2015  ? Procedure: BILATERAL SALPINGECTOMY;  Surgeon: Aloha Gell, MD;  Location: [facility];  Service: Gynecology;  Laterality: Bilateral;  ? BREAST REDUCTION SURGERY    ? REDUCTION MAMMAPLASTY Bilateral 2000  ? ROBOTIC ASSISTED TOTAL HYSTERECTOMY N/A 05/09/2015  ? Procedure: ROBOTIC ASSISTED TOTAL HYSTERECTOMY With Possible Power Morcellation;  Surgeon: Aloha Gell, MD;  Location: [facility];  Service: Gynecology;  Laterality: N/A;  ? ? ?OB History   ?No obstetric history on file. ?  ? ? ? ?Home Medications   ? ?Prior to Admission medications    ?Medication Sig Start Date End Date Taking? Authorizing Provider  ?amoxicillin-clavulanate (AUGMENTIN) 875-125 MG tablet Take 1 tablet by mouth 2 (two) times daily for 7 days. 04/10/22 04/17/22 Yes Eliezer Lofts, FNP  ?B Complex Vitamins (B COMPLEX PO) Take by mouth.   Yes [provider]  ?benzonatate (TESSALON) 200 MG capsule Take 1 capsule (200 mg total) by mouth 3 (three) times daily as needed for up to 7 days for cough. 04/10/22 04/17/22 Yes Eliezer Lofts, FNP  ?Cholecalciferol (VITAMIN D PO) Take by mouth.   Yes [provider]  ?fexofenadine (ALLEGRA ALLERGY) 180 MG tablet Take 1 tablet (180 mg total) by mouth daily for 15 days. 04/10/22 04/25/22 Yes Eliezer Lofts, FNP  ?gabapentin (NEURONTIN) 300 MG capsule TAKE 1 CAPSULE BY MOUTH 3 TIMES  DAILY 12/14/21  Yes Breeback, Jade L, PA-C  ?HUMIRA PEN-PS/UV/ADOL HS START 40 MG/0.8ML PNKT  12/09/17  Yes [provider]  ?levothyroxine (SYNTHROID) 88 MCG tablet Take 1 tablet (88 mcg total) by mouth daily before breakfast. LABS FOR REFILLS 03/08/22  Yes Breeback, Jade L, PA-C  ?Multiple Vitamin (MULTIVITAMIN) tablet Take 1 tablet by mouth daily.   Yes [provider]  ?naproxen (NAPROSYN) 500 MG tablet TAKE 1 TABLET BY MOUTH TWICE A DAY WITH FOOD OR MILK AS NEEDED 01/21/22  Yes Breeback, Jade L, PA-C  ?Pitavastatin Calcium (LIVALO) 2 MG TABS Take 1 tablet (2 mg total) by mouth daily. NEEDS LABS 02/03/22  Yes Breeback,  Jade L, PA-C  ?predniSONE (DELTASONE) 20 MG tablet Take 3 tabs PO daily x 5 days. 04/10/22  Yes Eliezer Lofts, FNP  ?zolpidem (AMBIEN) 10 MG tablet Take 1 tablet (10 mg total) by mouth at bedtime as needed for sleep. 10/28/21  Yes Breeback, Jade L, PA-C  ? ? ?Family History ?Family History  ?Problem Relation Age of Onset  ? Congestive Heart Failure Mother   ? Gout Father   ? Cancer Sister   ? Diabetes Maternal Grandmother   ? Diabetes Paternal Grandmother   ? Cancer Paternal Grandmother   ?     non-lymphoma of the brain  ? Stroke  Paternal Grandfather   ? ? ?Social History ?Social History  ? ?Tobacco Use  ? Smoking status: Never  ? Smokeless tobacco: Never  ?Substance Use Topics  ? Alcohol use: Yes  ?  Alcohol/week: 14.0 standard drinks  ?  Types: 14 Glasses of wine per week  ?  Comment: daily wine  ? Drug use: No  ? ? ? ?Allergies   ?Atorvastatin, Neosporin [neomycin-bacitracin zn-polymyx], Simvastatin, and Tylenol [acetaminophen] ? ? ?Review of Systems ?Review of Systems  ?HENT:  Positive for congestion.   ?Respiratory:  Positive for cough.   ?     Chest congestion x3 days  ?All other systems reviewed and are negative. ? ? ?Physical Exam ?Triage Vital Signs ?ED Triage Vitals  ?Enc Vitals Group  ?   BP 04/10/22 1135 (!) 143/101  ?   Pulse Rate 04/10/22 1135 (!) 112  ?   Resp 04/10/22 1135 18  ?   Temp 04/10/22 1135 98.7 ?F (37.1 ?C)  ?   Temp Source 04/10/22 1135 Oral  ?   SpO2 04/10/22 1135 97 %  ?   Weight 04/10/22 1133 173 lb (78.5 kg)  ?   Height 04/10/22 1133 '5\' 8"'$  (1.727 m)  ?   Head Circumference --   ?   Peak Flow --   ?   Pain Score 04/10/22 1133 0  ?   Pain Loc --   ?   Pain Edu? --   ?   Excl. in [place]? --   ? ?No data found. ? ?Updated Vital Signs ?BP (!) 143/101 (BP Location: Left Arm)   Pulse (!) 112   Temp 98.7 ?F (37.1 ?C) (Oral)   Resp 18   Ht '5\' 8"'$  (1.727 m)   Wt 173 lb (78.5 kg)   LMP 04/29/2015 (Exact Date)   SpO2 97%   BMI 26.30 kg/m?  ? ?  ? ?Physical Exam ?Vitals and nursing note reviewed.  ?Constitutional:   ?   Appearance: Normal appearance. She is obese.  ?HENT:  ?   Head: Normocephalic and atraumatic.  ?   Right Ear: Tympanic membrane and external ear normal.  ?   Left Ear: Tympanic membrane and external ear normal.  ?   Ears:  ?   Comments: Eustachian tube dysfunction noted bilaterally ?   Nose:  ?   Comments: Turbinates are erythematous/edematous ?   Mouth/Throat:  ?   Mouth: Mucous membranes are moist.  ?   Pharynx: Oropharynx is clear.  ?   Comments: Moderate to significant amount of clear drainage of  posterior oropharynx noted ?Eyes:  ?   Extraocular Movements: Extraocular movements intact.  ?   Conjunctiva/sclera: Conjunctivae normal.  ?   Pupils: Pupils are equal, round, and reactive to light.  ?Cardiovascular:  ?   Rate and Rhythm: Normal rate and regular rhythm.  ?  Pulses: Normal pulses.  ?   Heart sounds: Normal heart sounds. No murmur heard. ?Pulmonary:  ?   Effort: Pulmonary effort is normal.  ?   Breath sounds: Normal breath sounds. No wheezing, rhonchi or rales.  ?   Comments: Infrequent nonproductive cough noted on exam ?Musculoskeletal:  ?   Cervical back: Normal range of motion and neck supple.  ?Skin: ?   General: Skin is warm and dry.  ?Neurological:  ?   General: No focal deficit present.  ?   Mental Status: She is alert and oriented to person, place, and time.  ? ? ? ?UC Treatments / Results  ?Labs ?(all labs ordered are listed, but only abnormal results are displayed) ?Labs Reviewed - No data to display ? ?EKG ? ? ?Radiology ?No results found. ? ?Procedures ?Procedures (including critical care time) ? ?Medications Ordered in UC ?Medications - No data to display ? ?Initial Impression / Assessment and Plan / UC Course  ?I have reviewed the triage vital signs and the nursing notes. ? ?Pertinent labs & imaging results that were available during my care of the patient were reviewed by me and considered in my medical decision making (see chart for details). ? ?  ? ?MDM: 1.  Acute maxillary sinusitis, recurrence not specified-Rx'd Augmentin; 2.  Cough-Rx'd prednisone, Tessalon Perles; 3.  Allergic rhinitis-Rx'd Allegra. Advised patient to take medication as directed with food to completion.  Instructed patient to take prednisone and Allegra with first dose of Augmentin for the next 5 of 7 days.  Advised may use Allegra as needed afterwards for concurrent postnasal drainage/drip.  Advised may use Tessalon Perles daily or as needed for cough.  Encouraged patient to increase daily water intake while  taking these medications.  Advised patient if symptoms worsen and/or unresolved please follow-up with PCP or here for further evaluation.  Patient discharged home, hemodynamically stable ?Final Clinical Impressions(s) / UC Dia

## 2022-04-10 NOTE — ED Triage Notes (Signed)
Pt states that  she has a cough, chest congestion, and loss of voice. X3 days ? ?Pt states that she is vaccinated for covid.  ?Pt states that she has had flu vaccine. ?

## 2022-04-10 NOTE — Discharge Instructions (Addendum)
Advised patient to take medication as directed with food to completion.  Instructed patient to take prednisone and Allegra with first dose of Augmentin for the next 5 of 7 days.  Advised may use Allegra as needed afterwards for concurrent postnasal drainage/drip.  Advised may use Tessalon Perles daily or as needed for cough.  Encouraged patient to increase daily water intake while taking these medications.  Advised patient if symptoms worsen and/or unresolved please follow-up with PCP or here for further evaluation. ?

## 2022-04-14 ENCOUNTER — Encounter: Payer: Self-pay | Admitting: Physician Assistant

## 2022-04-14 MED ORDER — NAPROXEN 500 MG PO TABS: 500.0000 mg | ORAL_TABLET | Freq: Two times a day (BID) | ORAL | 1 refills | Status: AC

## 2022-05-23 ENCOUNTER — Other Ambulatory Visit: Payer: Self-pay | Admitting: Physician Assistant

## 2022-05-23 DIAGNOSIS — E782 Mixed hyperlipidemia: Secondary | ICD-10-CM

## 2022-05-23 DIAGNOSIS — E039 Hypothyroidism, unspecified: Secondary | ICD-10-CM

## 2022-05-29 ENCOUNTER — Encounter: Payer: Self-pay | Admitting: Physician Assistant

## 2022-05-29 ENCOUNTER — Other Ambulatory Visit: Payer: Self-pay | Admitting: Physician Assistant

## 2022-05-29 DIAGNOSIS — F5101 Primary insomnia: Secondary | ICD-10-CM

## 2022-06-21 ENCOUNTER — Other Ambulatory Visit: Payer: Self-pay | Admitting: Physician Assistant

## 2022-06-21 DIAGNOSIS — E039 Hypothyroidism, unspecified: Secondary | ICD-10-CM

## 2022-06-21 DIAGNOSIS — E782 Mixed hyperlipidemia: Secondary | ICD-10-CM

## 2022-09-01 ENCOUNTER — Other Ambulatory Visit: Payer: Self-pay | Admitting: Physician Assistant

## 2022-09-01 DIAGNOSIS — F5101 Primary insomnia: Secondary | ICD-10-CM

## 2022-09-08 ENCOUNTER — Telehealth (INDEPENDENT_AMBULATORY_CARE_PROVIDER_SITE_OTHER): Payer: 59 | Admitting: Physician Assistant

## 2022-09-08 ENCOUNTER — Encounter: Payer: Self-pay | Admitting: Physician Assistant

## 2022-09-08 DIAGNOSIS — E039 Hypothyroidism, unspecified: Secondary | ICD-10-CM | POA: Diagnosis not present

## 2022-09-08 DIAGNOSIS — F5101 Primary insomnia: Secondary | ICD-10-CM | POA: Diagnosis not present

## 2022-09-08 MED ORDER — ZOLPIDEM TARTRATE 10 MG PO TABS: 10.0000 mg | ORAL_TABLET | Freq: Every evening | ORAL | 1 refills | Status: DC | PRN

## 2022-09-08 MED ORDER — LEVOTHYROXINE SODIUM 88 MCG PO TABS: 88.0000 ug | ORAL_TABLET | Freq: Every day | ORAL | 3 refills | Status: DC

## 2022-09-08 NOTE — Progress Notes (Signed)
..  Virtual Visit via Video Note  I connected with Summer Santiago on 09/08/22 at 11:30 AM EDT by a video enabled telemedicine application and verified that I am speaking with the correct person using two identifiers.  Location: Patient: home Provider: clinic  .Marland KitchenParticipating in visit:  Patient: Summer Santiago Provider: Iran Planas PA-C Provider in training: Elsie Lincoln PA-S   I discussed the limitations of evaluation and management by telemedicine and the availability of in person appointments. The patient expressed understanding and agreed to proceed.  History of Present Illness: Pt is a 51 yo female with insomnia, hypothyroidism, psoriatic arthritis who needs medication refills.   She is doing really well with sleep, mood, energy. No concerns. She is taking medication daily.   .. Active Ambulatory Problems    Diagnosis Date Noted   Hypothyroidism 11/26/2013   Dyslipidemia (high LDL; low HDL) 11/26/2013   Insomnia 11/26/2013   Vitamin D deficiency 12/27/2013   Rosacea 11/13/2014   Pelvic mass in female 02/14/2015   Uterine fibroid 03/03/2015   Fibroids 05/09/2015   Right shoulder pain 11/13/2015   Psoriasis 03/02/2016   Elevated BP 03/02/2016   Breast mass, right 09/12/2017   Epiploic appendagitis 12/23/2017   Perioral dermatitis 06/09/2019   Allergic reaction 12/19/2020   Elevated liver function tests 12/19/2020   Elevated liver enzymes 12/22/2020   Elevated fasting blood sugar 12/22/2020   Psoriatic arthritis ([place]) 11/03/2021   Resolved Ambulatory Problems    Diagnosis Date Noted   No Resolved Ambulatory Problems   Past Medical History:  Diagnosis Date   Hyperlipidemia    Thyroid disease     Observations/Objective: No acute distress Normal mood and appearance  .Marland Kitchen Today's Vitals   09/08/22 1122  BP: 130/70  Weight: 173 lb (78.5 kg)  Height: '5\' 8"'$  (1.727 m)   Body mass index is 26.3 kg/m.    Assessment and Plan: Marland KitchenMarland KitchenKinzy was seen today for  follow-up.  Diagnoses and all orders for this visit:  Acquired hypothyroidism -     levothyroxine (SYNTHROID) 88 MCG tablet; Take 1 tablet (88 mcg total) by mouth daily before breakfast.  Primary insomnia -     zolpidem (AMBIEN) 10 MG tablet; Take 1 tablet (10 mg total) by mouth at bedtime as needed. for sleep   Needs labs in Jan 2024 Refilled medications Follow up in 6 months    Follow Up Instructions:    I discussed the assessment and treatment plan with the patient. The patient was provided an opportunity to ask questions and all were answered. The patient agreed with the plan and demonstrated an understanding of the instructions.   The patient was advised to call back or seek an in-person evaluation if the symptoms worsen or if the condition fails to improve as anticipated.   Iran Planas, PA-C

## 2022-12-15 ENCOUNTER — Other Ambulatory Visit: Payer: Self-pay | Admitting: Physician Assistant

## 2022-12-15 DIAGNOSIS — Z1231 Encounter for screening mammogram for malignant neoplasm of breast: Secondary | ICD-10-CM

## 2023-02-03 ENCOUNTER — Ambulatory Visit
Admission: RE | Admit: 2023-02-03 | Discharge: 2023-02-03 | Disposition: A | Discharge status: Home / Self Care | Payer: 59 | Source: Ambulatory Visit | Attending: Physician Assistant | Admitting: Physician Assistant

## 2023-02-03 DIAGNOSIS — Z1231 Encounter for screening mammogram for malignant neoplasm of breast: Secondary | ICD-10-CM

## 2023-02-04 NOTE — Progress Notes (Signed)
Normal mammogram. Follow up in 1 year.

## 2023-03-12 ENCOUNTER — Other Ambulatory Visit: Payer: Self-pay | Admitting: Physician Assistant

## 2023-03-12 DIAGNOSIS — F5101 Primary insomnia: Secondary | ICD-10-CM

## 2023-03-14 NOTE — Telephone Encounter (Signed)
Last OV: 09/08/22 Next OV: None scheduled Last RF: 09/08/22

## 2023-03-30 ENCOUNTER — Ambulatory Visit (INDEPENDENT_AMBULATORY_CARE_PROVIDER_SITE_OTHER): Payer: 59

## 2023-03-30 ENCOUNTER — Ambulatory Visit (INDEPENDENT_AMBULATORY_CARE_PROVIDER_SITE_OTHER): Payer: 59 | Admitting: Physician Assistant

## 2023-03-30 DIAGNOSIS — Z9189 Other specified personal risk factors, not elsewhere classified: Secondary | ICD-10-CM | POA: Insufficient documentation

## 2023-03-30 DIAGNOSIS — Z13820 Encounter for screening for osteoporosis: Secondary | ICD-10-CM | POA: Diagnosis not present

## 2023-03-30 DIAGNOSIS — Z1211 Encounter for screening for malignant neoplasm of colon: Secondary | ICD-10-CM

## 2023-03-30 DIAGNOSIS — R7303 Prediabetes: Secondary | ICD-10-CM | POA: Insufficient documentation

## 2023-03-30 DIAGNOSIS — E039 Hypothyroidism, unspecified: Secondary | ICD-10-CM | POA: Diagnosis not present

## 2023-03-30 DIAGNOSIS — Z111 Encounter for screening for respiratory tuberculosis: Secondary | ICD-10-CM

## 2023-03-30 DIAGNOSIS — Z13220 Encounter for screening for lipoid disorders: Secondary | ICD-10-CM

## 2023-03-30 DIAGNOSIS — E559 Vitamin D deficiency, unspecified: Secondary | ICD-10-CM

## 2023-03-30 DIAGNOSIS — Z23 Encounter for immunization: Secondary | ICD-10-CM | POA: Diagnosis not present

## 2023-03-30 DIAGNOSIS — L4050 Arthropathic psoriasis, unspecified: Secondary | ICD-10-CM

## 2023-03-30 DIAGNOSIS — E782 Mixed hyperlipidemia: Secondary | ICD-10-CM

## 2023-03-30 DIAGNOSIS — F5101 Primary insomnia: Secondary | ICD-10-CM

## 2023-03-30 DIAGNOSIS — Z79899 Other long term (current) drug therapy: Secondary | ICD-10-CM

## 2023-03-30 DIAGNOSIS — R0602 Shortness of breath: Secondary | ICD-10-CM

## 2023-03-30 DIAGNOSIS — Z8249 Family history of ischemic heart disease and other diseases of the circulatory system: Secondary | ICD-10-CM

## 2023-03-30 MED ORDER — ZOLPIDEM TARTRATE 10 MG PO TABS: ORAL_TABLET | ORAL | 1 refills | Status: DC

## 2023-03-30 NOTE — Progress Notes (Unsigned)
Established Patient Office Visit  Subjective   Patient ID: Summer Santiago, female    DOB: 16-Aug-1971  Age: 52 y.o. MRN: [id number]  Chief Complaint  Patient presents with   Follow-up    HPI Pt is a 52 yo female with hypothyroidism, pre-diabetes,  Psoriatic arthritis, Insomnia, HLD who presents to the clinic for follow up and medication management.   Pt is doing pretty good. Her pain is managed. Her mood is good. Sleeping is going well with ambien. She is complaint with medications.   She is having more SOB with exertion and mother has CHF and dx with genetic mutation of unknown significance. Denies any lower extremity edema. She is able to lay flat.   She is not exercising much right now.   .. Active Ambulatory Problems    Diagnosis Date Noted   Hypothyroidism 11/26/2013   Dyslipidemia (high LDL; low HDL) 11/26/2013   Insomnia 11/26/2013   Vitamin D deficiency 12/27/2013   Rosacea 11/13/2014   Pelvic mass in female 02/14/2015   Uterine fibroid 03/03/2015   Fibroids 05/09/2015   Right shoulder pain 11/13/2015   Psoriasis 03/02/2016   Elevated BP 03/02/2016   Breast mass, right 09/12/2017   Epiploic appendagitis 12/23/2017   Perioral dermatitis 06/09/2019   Allergic reaction 12/19/2020   Elevated liver function tests 12/19/2020   Elevated liver enzymes 12/22/2020   Elevated fasting blood sugar 12/22/2020   Psoriatic arthritis 11/03/2021   Family history of CHF (congestive heart failure) 03/30/2023   At high risk for osteoporosis 03/30/2023   Pre-diabetes 03/30/2023   SOB (shortness of breath) on exertion 03/31/2023   Mixed hyperlipidemia 03/31/2023   Resolved Ambulatory Problems    Diagnosis Date Noted   No Resolved Ambulatory Problems   Past Medical History:  Diagnosis Date   Hyperlipidemia    Thyroid disease      ROS See HPI.    Objective:     LMP 04/29/2015 (Exact Date)  BP Readings from Last 3 Encounters:  09/08/22 130/70  04/10/22 (!) 143/101   01/15/22 (!) 148/81   Wt Readings from Last 3 Encounters:  09/08/22 173 lb (78.5 kg)  04/10/22 173 lb (78.5 kg)  01/15/22 179 lb (81.2 kg)      Physical Exam Constitutional:      Appearance: Normal appearance.  HENT:     Head: Normocephalic.  Cardiovascular:     Rate and Rhythm: Normal rate and regular rhythm.     Pulses: Normal pulses.     Heart sounds: Normal heart sounds.  Pulmonary:     Effort: Pulmonary effort is normal.     Breath sounds: Normal breath sounds.  Musculoskeletal:     Cervical back: Normal range of motion and neck supple. No tenderness.     Right lower leg: No edema.     Left lower leg: No edema.  Lymphadenopathy:     Cervical: No cervical adenopathy.  Neurological:     General: No focal deficit present.     Mental Status: She is alert and oriented to person, place, and time.  Psychiatric:        Mood and Affect: Mood normal.    EKG- NSR with no acute changes.    The 10-year ASCVD risk score (Arnett DK, et al., 2019) is: 1.6%    Assessment & Plan:  Marland KitchenMarland KitchenRoshawna was seen today for follow-up.  Diagnoses and all orders for this visit:  Acquired hypothyroidism -     TSH + free T4 -  T4, free  Pre-diabetes -     Hemoglobin A1c -     COMPLETE METABOLIC PANEL WITH GFR  Medication management -     TSH + free T4 -     Hemoglobin A1c -     COMPLETE METABOLIC PANEL WITH GFR -     Lipid Panel w/reflex Direct LDL -     VITAMIN D 25 Hydroxy (Vit-D Deficiency, Fractures) -     CBC w/Diff/Platelet  Colon cancer screening -     Cologuard  Vitamin D deficiency -     VITAMIN D 25 Hydroxy (Vit-D Deficiency, Fractures)  Screening for lipid disorders -     Lipid Panel w/reflex Direct LDL  Osteoporosis screening -     DG Bone Density; Future  At high risk for osteoporosis -     DG Bone Density; Future  Family history of CHF (congestive heart failure) -     EKG 12-Lead -     ECHOCARDIOGRAM COMPLETE; Future  Primary insomnia -     zolpidem  (AMBIEN) 10 MG tablet; TAKE 1 TABLET AT BEDTIME AS NEEDED SLEEP  Psoriatic arthritis  Mixed hyperlipidemia  Tuberculosis screening -     TB Skin Test  SOB (shortness of breath) on exertion -     EKG 12-Lead -     ECHOCARDIOGRAM COMPLETE; Future  Need for Tdap vaccination -     Tdap vaccine greater than or equal to 7yo IM   Tdap given today Echo and EKG done for baseline due to family history and SOB on exertion Cologuard ordered-pt declined colonoscopy Bone density ordered due to higher risk due to biologicals Screening and for medication management labs ordered TB skin test for work-follow up in 48-72 hours Ambien refilled-sleeping well.      Return in about 6 months (around 09/29/2023).   Spent 48 minutes reviewing patients chart, coordinating management and care and discussing plan with patient.    Tandy Gaw, PA-C

## 2023-03-30 NOTE — Progress Notes (Signed)
Normal bone density with T-score of 1.2.  continue vitamin D and calcium with exercise to prevent any bone density decrease. Recheck in 3-5 years.

## 2023-03-30 NOTE — Patient Instructions (Addendum)
Cologuard ordered for colon cancer screening Bone density ordered for osteoporosis screening Will get labs today Ordered echo

## 2023-03-31 ENCOUNTER — Encounter: Payer: Self-pay | Admitting: Physician Assistant

## 2023-03-31 ENCOUNTER — Other Ambulatory Visit: Payer: Self-pay | Admitting: Physician Assistant

## 2023-03-31 DIAGNOSIS — E782 Mixed hyperlipidemia: Secondary | ICD-10-CM

## 2023-03-31 DIAGNOSIS — R0602 Shortness of breath: Secondary | ICD-10-CM | POA: Insufficient documentation

## 2023-03-31 LAB — CBC WITH DIFFERENTIAL/PLATELET
Absolute Monocytes: 234 cells/uL (ref 200–950)
Basophils Absolute: 29 cells/uL (ref 0–200)
Basophils Relative: 0.7 %
Eosinophils Absolute: 103 cells/uL (ref 15–500)
Eosinophils Relative: 2.5 %
HCT: 39.8 % (ref 35.0–45.0)
Hemoglobin: 13 g/dL (ref 11.7–15.5)
Lymphs Abs: 648 cells/uL — ABNORMAL LOW (ref 850–3900)
MCH: 28.7 pg (ref 27.0–33.0)
MCHC: 32.7 g/dL (ref 32.0–36.0)
MCV: 87.9 fL (ref 80.0–100.0)
MPV: 10.4 fL (ref 7.5–12.5)
Monocytes Relative: 5.7 %
Neutro Abs: 3087 cells/uL (ref 1500–7800)
Neutrophils Relative %: 75.3 %
Platelets: 267 10*3/uL (ref 140–400)
RBC: 4.53 10*6/uL (ref 3.80–5.10)
RDW: 14.7 % (ref 11.0–15.0)
Total Lymphocyte: 15.8 %
WBC: 4.1 10*3/uL (ref 3.8–10.8)

## 2023-03-31 LAB — T4, FREE: Free T4: 1.3 ng/dL (ref 0.8–1.8)

## 2023-03-31 LAB — COMPLETE METABOLIC PANEL WITH GFR
AG Ratio: 1 (calc) (ref 1.0–2.5)
ALT: 43 U/L — ABNORMAL HIGH (ref 6–29)
AST: 47 U/L — ABNORMAL HIGH (ref 10–35)
Albumin: 3.8 g/dL (ref 3.6–5.1)
Alkaline phosphatase (APISO): 99 U/L (ref 37–153)
BUN: 11 mg/dL (ref 7–25)
CO2: 27 mmol/L (ref 20–32)
Calcium: 9.7 mg/dL (ref 8.6–10.4)
Chloride: 104 mmol/L (ref 98–110)
Creat: 0.76 mg/dL (ref 0.50–1.03)
Globulin: 3.7 g/dL (calc) (ref 1.9–3.7)
Glucose, Bld: 91 mg/dL (ref 65–99)
Potassium: 4.6 mmol/L (ref 3.5–5.3)
Sodium: 139 mmol/L (ref 135–146)
Total Bilirubin: 0.4 mg/dL (ref 0.2–1.2)
Total Protein: 7.5 g/dL (ref 6.1–8.1)
eGFR: 95 mL/min/{1.73_m2} (ref >=60)

## 2023-03-31 LAB — VITAMIN D 25 HYDROXY (VIT D DEFICIENCY, FRACTURES): Vit D, 25-Hydroxy: 83 ng/mL (ref 30–100)

## 2023-03-31 LAB — LIPID PANEL W/REFLEX DIRECT LDL
Cholesterol: 158 mg/dL (ref <200)
HDL: 35 mg/dL — ABNORMAL LOW (ref >=50)
LDL Cholesterol (Calc): 91 mg/dL (calc)
Non-HDL Cholesterol (Calc): 123 mg/dL (calc) (ref <130)
Total CHOL/HDL Ratio: 4.5 (calc) (ref <5.0)
Triglycerides: 233 mg/dL — ABNORMAL HIGH (ref <150)

## 2023-03-31 LAB — HEMOGLOBIN A1C
Hgb A1c MFr Bld: 6.3 % of total Hgb — ABNORMAL HIGH (ref <5.7)
Mean Plasma Glucose: 134 mg/dL
eAG (mmol/L): 7.4 mmol/L

## 2023-03-31 LAB — TSH+FREE T4: TSH W/REFLEX TO FT4: 6.28 mIU/L — ABNORMAL HIGH

## 2023-03-31 MED ORDER — PITAVASTATIN CALCIUM 2 MG PO TABS: 1.0000 | ORAL_TABLET | Freq: Every day | ORAL | 3 refills | Status: DC

## 2023-03-31 NOTE — Progress Notes (Signed)
Summer Santiago,   TSH elevated. Trending towards too low thyroid hormone but your free T4 is perfect. Are you having HYPO thyroid symptoms?    A1C still in pre-diabetes but has increased some. Recheck in 6 months. Continue to work on low sugar and carb diet as well as 150 minutes of exercise a week. We could consider metformin to help with insulin resistance? Thoughts?   Liver enzymes MUCH better.   Cholesterol much better.  Continue on livalo.  TG still elevated.  Vitamin D looks great.

## 2023-04-01 ENCOUNTER — Ambulatory Visit (INDEPENDENT_AMBULATORY_CARE_PROVIDER_SITE_OTHER): Payer: 59 | Admitting: Physician Assistant

## 2023-04-01 VITALS — BP 116/71 | HR 90 | Ht 68.0 in

## 2023-04-01 DIAGNOSIS — Z111 Encounter for screening for respiratory tuberculosis: Secondary | ICD-10-CM

## 2023-04-01 LAB — TB SKIN TEST
Induration: 0 mm
TB Skin Test: NEGATIVE

## 2023-04-01 MED ORDER — LEVOTHYROXINE SODIUM 100 MCG PO TABS: 100.0000 ug | ORAL_TABLET | Freq: Every day | ORAL | 1 refills | Status: DC

## 2023-04-01 NOTE — Progress Notes (Signed)
   Established Patient Office Visit  Subjective   Patient ID: Summer Santiago, female    DOB: July 13, 1971  Age: 52 y.o. MRN: [id number]  Chief Complaint  Patient presents with   PPD Reading    PPD read nurse visit.     HPI  PPD read  Nurse visit . PPD administered in office on 03/30/2023.    ROS    Objective:     BP 116/71   Pulse 90   Ht 5\' 8"  (1.727 m)   LMP 04/29/2015 (Exact Date)   SpO2 100%   BMI 26.30 kg/m    Physical Exam   No results found for any visits on 04/01/23.    The 10-year ASCVD risk score (Arnett DK, et al., 2019) is: 1.3%    Assessment & Plan:  PPD read - results - negative no redness or induration.  Problem List Items Addressed This Visit   None   No follow-ups on file.    Elizabeth Palau, LPN

## 2023-04-01 NOTE — Progress Notes (Signed)
Negative TB screening

## 2023-04-13 ENCOUNTER — Encounter: Payer: Self-pay | Admitting: Physician Assistant

## 2023-04-14 LAB — COLOGUARD: COLOGUARD: NEGATIVE

## 2023-04-14 NOTE — Progress Notes (Signed)
Negative cologuard. Follow up in 3 years.

## 2023-04-25 ENCOUNTER — Other Ambulatory Visit: Payer: Self-pay | Admitting: Physician Assistant

## 2023-04-26 ENCOUNTER — Encounter: Payer: Self-pay | Admitting: Physician Assistant

## 2023-04-26 ENCOUNTER — Telehealth (INDEPENDENT_AMBULATORY_CARE_PROVIDER_SITE_OTHER): Payer: 59 | Admitting: Physician Assistant

## 2023-04-26 DIAGNOSIS — J40 Bronchitis, not specified as acute or chronic: Secondary | ICD-10-CM

## 2023-04-26 DIAGNOSIS — J329 Chronic sinusitis, unspecified: Secondary | ICD-10-CM

## 2023-04-26 MED ORDER — AZITHROMYCIN 250 MG PO TABS: ORAL_TABLET | ORAL | 0 refills | Status: DC

## 2023-04-26 NOTE — Progress Notes (Signed)
..  Virtual Visit via Video Note  I connected with Summer Santiago on 04/26/23 at 11:30 AM EDT by a video enabled telemedicine application and verified that I am speaking with the correct person using two identifiers.  Location: Patient: home Provider: clinic  .Marland KitchenParticipating in visit:  Patient: Summer Santiago Provider: Tandy Gaw PA-C   I discussed the limitations of evaluation and management by telemedicine and the availability of in person appointments. The patient expressed understanding and agreed to proceed.  History of Present Illness: Pt is a 52 yo female who is immunosuppressed who calls into the clinic with sinus pressure, congestion, cough for last 4 days. She has been at the hospital a lot with the death of her mother in law. She is coughing up green sputum. Taking mucinex D with little relief. No fever, body aches, chills. Her chest does feel a little tight. No hx of asthma.   .. Active Ambulatory Problems    Diagnosis Date Noted   Hypothyroidism 11/26/2013   Dyslipidemia (high LDL; low HDL) 11/26/2013   Insomnia 11/26/2013   Vitamin D deficiency 12/27/2013   Rosacea 11/13/2014   Pelvic mass in female 02/14/2015   Uterine fibroid 03/03/2015   Fibroids 05/09/2015   Right shoulder pain 11/13/2015   Psoriasis 03/02/2016   Elevated BP 03/02/2016   Breast mass, right 09/12/2017   Epiploic appendagitis 12/23/2017   Perioral dermatitis 06/09/2019   Allergic reaction 12/19/2020   Elevated liver function tests 12/19/2020   Elevated liver enzymes 12/22/2020   Elevated fasting blood sugar 12/22/2020   Psoriatic arthritis (HCC) 11/03/2021   Family history of CHF (congestive heart failure) 03/30/2023   At high risk for osteoporosis 03/30/2023   Pre-diabetes 03/30/2023   SOB (shortness of breath) on exertion 03/31/2023   Mixed hyperlipidemia 03/31/2023   Tuberculosis screening 04/01/2023   Resolved Ambulatory Problems    Diagnosis Date Noted   No Resolved Ambulatory Problems    Past Medical History:  Diagnosis Date   Hyperlipidemia    Thyroid disease       Observations/Objective: No acute distress Normal breathing   Assessment and Plan: Marland KitchenMarland KitchenLanija was seen today for cough.  Diagnoses and all orders for this visit:  Sinobronchitis -     azithromycin (ZITHROMAX Z-PAK) 250 MG tablet; Take 2 tablets (500 mg) on  Day 1,  followed by 1 tablet (250 mg) once daily on Days 2 through 5.   Sent zpak Continue symptomatic care Rest and hydrate Follow up as needed or if symptoms worsen   Follow Up Instructions:    I discussed the assessment and treatment plan with the patient. The patient was provided an opportunity to ask questions and all were answered. The patient agreed with the plan and demonstrated an understanding of the instructions.   The patient was advised to call back or seek an in-person evaluation if the symptoms worsen or if the condition fails to improve as anticipated.    Tandy Gaw, PA-C

## 2023-04-26 NOTE — Progress Notes (Signed)
An attempt reach was made 1:09am  left vm for pt to call back to go over medications and pharamcy

## 2023-05-13 ENCOUNTER — Ambulatory Visit (HOSPITAL_COMMUNITY): Payer: 59 | Attending: Physician Assistant

## 2023-05-13 DIAGNOSIS — R0602 Shortness of breath: Secondary | ICD-10-CM | POA: Insufficient documentation

## 2023-05-13 DIAGNOSIS — Z8249 Family history of ischemic heart disease and other diseases of the circulatory system: Secondary | ICD-10-CM | POA: Diagnosis present

## 2023-05-13 LAB — ECHOCARDIOGRAM COMPLETE
Area-P 1/2: 3.85 cm2
S' Lateral: 2.9 cm

## 2023-05-16 NOTE — Progress Notes (Signed)
Normal Ejection fraction.  Minimal diastolic dysfunction with impaired relaxation.  No significant valve dysfunction.

## 2023-09-30 ENCOUNTER — Encounter: Payer: Self-pay | Admitting: Physician Assistant

## 2023-09-30 ENCOUNTER — Ambulatory Visit (INDEPENDENT_AMBULATORY_CARE_PROVIDER_SITE_OTHER): Payer: 59 | Admitting: Physician Assistant

## 2023-09-30 VITALS — BP 119/77 | HR 91 | Ht 68.0 in | Wt 173.0 lb

## 2023-09-30 DIAGNOSIS — F5101 Primary insomnia: Secondary | ICD-10-CM

## 2023-09-30 DIAGNOSIS — E782 Mixed hyperlipidemia: Secondary | ICD-10-CM

## 2023-09-30 DIAGNOSIS — R7303 Prediabetes: Secondary | ICD-10-CM

## 2023-09-30 DIAGNOSIS — Z23 Encounter for immunization: Secondary | ICD-10-CM | POA: Diagnosis not present

## 2023-09-30 DIAGNOSIS — E039 Hypothyroidism, unspecified: Secondary | ICD-10-CM

## 2023-09-30 LAB — POCT GLYCOSYLATED HEMOGLOBIN (HGB A1C): Hemoglobin A1C: 5.5 % (ref 4.0–5.6)

## 2023-09-30 MED ORDER — LEVOTHYROXINE SODIUM 100 MCG PO TABS: 100.0000 ug | ORAL_TABLET | Freq: Every day | ORAL | 1 refills | Status: DC

## 2023-09-30 MED ORDER — ZOLPIDEM TARTRATE 10 MG PO TABS: ORAL_TABLET | ORAL | 1 refills | Status: DC

## 2023-09-30 NOTE — Progress Notes (Unsigned)
Established Patient Office Visit  Subjective   Patient ID: Summer Santiago, female    DOB: 1971/09/05  Age: 52 y.o. MRN: [id number]  Chief Complaint  Patient presents with   Diabetes    Last A1c = 6.2    HPI Pt is a 52 yo female with hypothyroidism, dyslipidemia, pre-diabetes, Psoriasis and insomnia who presents to the clinic for follow up.   She is doing well. No concerns or problems. She is compliant with medication. She has rheumatologist that she sees every 6 months. She tries to maintain a healthy diet and regular exercise. She is sleeping well with no concerns.   Active Ambulatory Problems    Diagnosis Date Noted   Hypothyroidism 11/26/2013   Dyslipidemia (high LDL; low HDL) 11/26/2013   Insomnia 11/26/2013   Vitamin D deficiency 12/27/2013   Rosacea 11/13/2014   Pelvic mass in female 02/14/2015   Uterine fibroid 03/03/2015   Fibroids 05/09/2015   Right shoulder pain 11/13/2015   Psoriasis 03/02/2016   Elevated BP 03/02/2016   Breast mass, right 09/12/2017   Epiploic appendagitis 12/23/2017   Perioral dermatitis 06/09/2019   Allergic reaction 12/19/2020   Elevated liver function tests 12/19/2020   Elevated liver enzymes 12/22/2020   Elevated fasting blood sugar 12/22/2020   Psoriatic arthritis (HCC) 11/03/2021   Family history of CHF (congestive heart failure) 03/30/2023   At high risk for osteoporosis 03/30/2023   Pre-diabetes 03/30/2023   SOB (shortness of breath) on exertion 03/31/2023   Mixed hyperlipidemia 03/31/2023   Tuberculosis screening 04/01/2023   Resolved Ambulatory Problems    Diagnosis Date Noted   No Resolved Ambulatory Problems   Past Medical History:  Diagnosis Date   Hyperlipidemia    Thyroid disease      Review of Systems  All other systems reviewed and are negative.     Objective:     BP 119/77   Pulse 91   Ht 5\' 8"  (1.727 m)   Wt 173 lb (78.5 kg)   LMP 04/29/2015 (Exact Date)   SpO2 99%   BMI 26.30 kg/m  BP Readings  from Last 3 Encounters:  09/30/23 119/77  04/01/23 116/71  09/08/22 130/70   Wt Readings from Last 3 Encounters:  09/30/23 173 lb (78.5 kg)  09/08/22 173 lb (78.5 kg)  04/10/22 173 lb (78.5 kg)   .Marland Kitchen Results for orders placed or performed in visit on 09/30/23  POCT HgB A1C  Result Value Ref Range   Hemoglobin A1C 5.5 4.0 - 5.6 %   HbA1c POC (<> result, manual entry)     HbA1c, POC (prediabetic range)     HbA1c, POC (controlled diabetic range)        Physical Exam Constitutional:      Appearance: Normal appearance.  HENT:     Head: Normocephalic.  Cardiovascular:     Rate and Rhythm: Normal rate and regular rhythm.  Pulmonary:     Effort: Pulmonary effort is normal.     Breath sounds: Normal breath sounds.  Neurological:     General: No focal deficit present.     Mental Status: She is alert and oriented to person, place, and time.  Psychiatric:        Mood and Affect: Mood normal.       The 10-year ASCVD risk score (Arnett DK, et al., 2019) is: 1.5%    Assessment & Plan:  Marland KitchenMarland KitchenJarely was seen today for diabetes.  Diagnoses and all orders for this visit:  Pre-diabetes -  POCT HgB A1C  Mixed hyperlipidemia  Acquired hypothyroidism -     levothyroxine (SYNTHROID) 100 MCG tablet; Take 1 tablet (100 mcg total) by mouth daily. -     TSH + free T4  Primary insomnia -     zolpidem (AMBIEN) 10 MG tablet; TAKE 1 TABLET AT BEDTIME AS NEEDED SLEEP  Encounter for immunization -     Flu vaccine trivalent PF, 6mos and older(Flulaval,Afluria,Fluarix,Fluzone)  Pt is doing great A1C has improved a lot and back down into normal range Keep up healthy diet and exercise Will get TSH and adjust medications accordingly Ambien refilled Flu shot given today  Continue livalo for cholesterol    Return in about 6 months (around 03/30/2024) for Follow up.    Tandy Gaw, PA-C

## 2023-10-23 ENCOUNTER — Other Ambulatory Visit: Payer: Self-pay | Admitting: Physician Assistant

## 2023-10-23 DIAGNOSIS — F5101 Primary insomnia: Secondary | ICD-10-CM

## 2023-10-26 ENCOUNTER — Encounter: Payer: Self-pay | Admitting: Physician Assistant

## 2023-10-26 DIAGNOSIS — E039 Hypothyroidism, unspecified: Secondary | ICD-10-CM

## 2023-10-26 LAB — TSH+FREE T4
Free T4: 1.26 ng/dL (ref 0.82–1.77)
TSH: 2.12 u[IU]/mL (ref 0.450–4.500)

## 2023-10-26 MED ORDER — LEVOTHYROXINE SODIUM 100 MCG PO TABS: 100.0000 ug | ORAL_TABLET | Freq: Every day | ORAL | 3 refills | Status: DC

## 2023-10-26 NOTE — Progress Notes (Signed)
Summer Santiago,   Thyroid looks great!!

## 2024-01-06 ENCOUNTER — Other Ambulatory Visit: Payer: Self-pay | Admitting: Physician Assistant

## 2024-01-06 DIAGNOSIS — Z1231 Encounter for screening mammogram for malignant neoplasm of breast: Secondary | ICD-10-CM

## 2024-02-10 ENCOUNTER — Ambulatory Visit: Payer: 59

## 2024-02-20 ENCOUNTER — Ambulatory Visit
Admission: RE | Admit: 2024-02-20 | Discharge: 2024-02-20 | Disposition: A | Discharge status: Home / Self Care | Payer: 59 | Source: Ambulatory Visit | Attending: Physician Assistant | Admitting: Physician Assistant

## 2024-02-20 DIAGNOSIS — Z1231 Encounter for screening mammogram for malignant neoplasm of breast: Secondary | ICD-10-CM

## 2024-02-21 ENCOUNTER — Encounter: Payer: Self-pay | Admitting: Physician Assistant

## 2024-02-21 NOTE — Progress Notes (Signed)
 Normal mammogram. Follow up in 1 year.

## 2024-02-22 ENCOUNTER — Encounter: Payer: Self-pay | Admitting: Physician Assistant

## 2024-02-22 DIAGNOSIS — F5101 Primary insomnia: Secondary | ICD-10-CM

## 2024-02-22 MED ORDER — ZOLPIDEM TARTRATE 10 MG PO TABS: ORAL_TABLET | ORAL | 1 refills | Status: DC

## 2024-03-26 ENCOUNTER — Encounter: Payer: Self-pay | Admitting: Physician Assistant

## 2024-03-26 DIAGNOSIS — E782 Mixed hyperlipidemia: Secondary | ICD-10-CM

## 2024-03-27 MED ORDER — PITAVASTATIN CALCIUM 2 MG PO TABS: 1.0000 | ORAL_TABLET | Freq: Every day | ORAL | 3 refills | Status: DC

## 2024-03-30 ENCOUNTER — Ambulatory Visit: Payer: 59 | Admitting: Physician Assistant

## 2024-03-30 DIAGNOSIS — R7303 Prediabetes: Secondary | ICD-10-CM

## 2024-03-30 DIAGNOSIS — E782 Mixed hyperlipidemia: Secondary | ICD-10-CM

## 2024-03-30 DIAGNOSIS — E039 Hypothyroidism, unspecified: Secondary | ICD-10-CM

## 2024-04-27 ENCOUNTER — Ambulatory Visit (INDEPENDENT_AMBULATORY_CARE_PROVIDER_SITE_OTHER): Admitting: Physician Assistant

## 2024-04-27 VITALS — BP 126/85 | HR 89 | Ht 68.0 in | Wt 179.2 lb

## 2024-04-27 DIAGNOSIS — L4050 Arthropathic psoriasis, unspecified: Secondary | ICD-10-CM

## 2024-04-27 DIAGNOSIS — E785 Hyperlipidemia, unspecified: Secondary | ICD-10-CM

## 2024-04-27 DIAGNOSIS — F5101 Primary insomnia: Secondary | ICD-10-CM

## 2024-04-27 DIAGNOSIS — E039 Hypothyroidism, unspecified: Secondary | ICD-10-CM | POA: Diagnosis not present

## 2024-04-27 DIAGNOSIS — E559 Vitamin D deficiency, unspecified: Secondary | ICD-10-CM

## 2024-04-27 DIAGNOSIS — E663 Overweight: Secondary | ICD-10-CM | POA: Insufficient documentation

## 2024-04-27 DIAGNOSIS — R7303 Prediabetes: Secondary | ICD-10-CM

## 2024-04-27 LAB — POCT GLYCOSYLATED HEMOGLOBIN (HGB A1C): Hemoglobin A1C: 5.7 % — AB (ref 4.0–5.6)

## 2024-04-27 NOTE — Progress Notes (Unsigned)
 Established Patient Office Visit  Subjective   Patient ID: Summer Santiago, female    DOB: 1971/07/17  Age: 53 y.o. MRN: [id number]  Chief Complaint  Patient presents with   ifg    HPI Summer Santiago is a 53 yo overweight female with pre-diabetes, hypothyroidism, HLD, Psoriatic arthritis, insomnia who presents to the clinic for 6 month follow up.   Summer Santiago is doing well. Summer Santiago would like to lose 20lbs. Summer Santiago admits Summer Santiago is not exercising like Summer Santiago should. Summer Santiago is sleeping well. Summer Santiago has ok energy. Her mood is good. Summer Santiago is sleeping well with ambien . No other concerns today but would ike to consider medication help with weight loss.   Rheumatolgy just took her off humira and placed on new once monthly injection, Bimzelx.  Summer Santiago follows up in 1-2 months.     ROS See HPI.    Objective:     BP 126/85   Pulse 89   Ht 5\' 8"  (1.727 m)   Wt 179 lb 3.2 oz (81.3 kg)   LMP 04/29/2015 (Exact Date)   SpO2 99%   BMI 27.25 kg/m  BP Readings from Last 3 Encounters:  04/27/24 126/85  09/30/23 119/77  04/01/23 116/71   Wt Readings from Last 3 Encounters:  04/27/24 179 lb 3.2 oz (81.3 kg)  09/30/23 173 lb (78.5 kg)  09/08/22 173 lb (78.5 kg)       Physical Exam Constitutional:      Appearance: Normal appearance.  HENT:     Head: Normocephalic.  Cardiovascular:     Rate and Rhythm: Normal rate and regular rhythm.  Pulmonary:     Effort: Pulmonary effort is normal.     Breath sounds: Normal breath sounds.  Musculoskeletal:     Cervical back: Normal range of motion and neck supple. No tenderness.     Right lower leg: No edema.     Left lower leg: No edema.  Lymphadenopathy:     Cervical: No cervical adenopathy.  Neurological:     General: No focal deficit present.     Mental Status: Summer Santiago is alert and oriented to person, place, and time.  Psychiatric:        Mood and Affect: Mood normal.        The 10-year ASCVD risk score (Arnett DK, et al., 2019) is: 1.7%    Assessment & Plan:  Summer Santiago  was seen today for ifg.  Diagnoses and all orders for this visit:  Pre-diabetes -     POCT HgB A1C -     CMP14+EGFR -     CBC w/Diff/Platelet  Hypothyroidism, unspecified type -     CMP14+EGFR -     TSH + free T4 -     CBC w/Diff/Platelet  Dyslipidemia (high LDL; low HDL) -     Lipid panel -     CBC w/Diff/Platelet  Vitamin D  deficiency -     CBC w/Diff/Platelet -     VITAMIN D  25 Hydroxy (Vit-D Deficiency, Fractures)  Psoriatic arthritis (HCC) -     CMP14+EGFR -     CBC w/Diff/Platelet  Primary insomnia  Overweight (BMI 25.0-29.9)   A1C pre-diabetes range at 5.7 and has improved since last check Needs fasting labs that were done today for evaluation on thyroid and cholesterol.  Discussed low carb diet with 1500 calories and 80g of protein daily.  Needs to focus on 150 minutes of exercise weekly.  Track exercise and food if helpful to keep goals.  BMI would  not meet requirements for GLP-1 Consider slenderiiz drops that are natural for weight loss.  Discussed off label use of wellbutrin for weight loss.  Follow up in 6 months.   Rheumatology to follow psoratic arthritis.     Return in about 6 months (around 10/28/2024).    Eola Waldrep, PA-C

## 2024-04-27 NOTE — Patient Instructions (Signed)
 Slenderiiz at [email or web address] Wellbutrin for weight loss

## 2024-04-28 ENCOUNTER — Encounter: Payer: Self-pay | Admitting: Physician Assistant

## 2024-04-28 LAB — CBC WITH DIFFERENTIAL/PLATELET
Basophils Absolute: 0 10*3/uL (ref 0.0–0.2)
Basos: 0 %
EOS (ABSOLUTE): 0.1 10*3/uL (ref 0.0–0.4)
Eos: 2 %
Hematocrit: 41 % (ref 34.0–46.6)
Hemoglobin: 13.5 g/dL (ref 11.1–15.9)
Immature Grans (Abs): 0 10*3/uL (ref 0.0–0.1)
Immature Granulocytes: 1 %
Lymphocytes Absolute: 0.5 10*3/uL — ABNORMAL LOW (ref 0.7–3.1)
Lymphs: 9 %
MCH: 31.5 pg (ref 26.6–33.0)
MCHC: 32.9 g/dL (ref 31.5–35.7)
MCV: 96 fL (ref 79–97)
Monocytes Absolute: 0.2 10*3/uL (ref 0.1–0.9)
Monocytes: 3 %
Neutrophils Absolute: 4.6 10*3/uL (ref 1.4–7.0)
Neutrophils: 85 %
Platelets: 216 10*3/uL (ref 150–450)
RBC: 4.29 x10E6/uL (ref 3.77–5.28)
RDW: 14.5 % (ref 11.7–15.4)
WBC: 5.4 10*3/uL (ref 3.4–10.8)

## 2024-04-28 LAB — CMP14+EGFR
ALT: 53 IU/L — ABNORMAL HIGH (ref 0–32)
AST: 43 IU/L — ABNORMAL HIGH (ref 0–40)
Albumin: 3.8 g/dL (ref 3.8–4.9)
Alkaline Phosphatase: 137 IU/L — ABNORMAL HIGH (ref 44–121)
BUN/Creatinine Ratio: 13 (ref 9–23)
BUN: 9 mg/dL (ref 6–24)
Bilirubin Total: 0.5 mg/dL (ref 0.0–1.2)
CO2: 23 mmol/L (ref 20–29)
Calcium: 9.1 mg/dL (ref 8.7–10.2)
Chloride: 102 mmol/L (ref 96–106)
Creatinine, Ser: 0.7 mg/dL (ref 0.57–1.00)
Globulin, Total: 3.5 g/dL (ref 1.5–4.5)
Glucose: 105 mg/dL — ABNORMAL HIGH (ref 70–99)
Potassium: 4.8 mmol/L (ref 3.5–5.2)
Sodium: 139 mmol/L (ref 134–144)
Total Protein: 7.3 g/dL (ref 6.0–8.5)
eGFR: 105 mL/min/{1.73_m2} (ref >59)

## 2024-04-28 LAB — VITAMIN D 25 HYDROXY (VIT D DEFICIENCY, FRACTURES): Vit D, 25-Hydroxy: 109 ng/mL — ABNORMAL HIGH (ref 30.0–100.0)

## 2024-04-28 LAB — LIPID PANEL
Chol/HDL Ratio: 4.5 ratio — ABNORMAL HIGH (ref 0.0–4.4)
Cholesterol, Total: 186 mg/dL (ref 100–199)
HDL: 41 mg/dL (ref >39)
LDL Chol Calc (NIH): 118 mg/dL — ABNORMAL HIGH (ref 0–99)
Triglycerides: 151 mg/dL — ABNORMAL HIGH (ref 0–149)
VLDL Cholesterol Cal: 27 mg/dL (ref 5–40)

## 2024-04-28 LAB — TSH+FREE T4
Free T4: 1.35 ng/dL (ref 0.82–1.77)
TSH: 3.13 u[IU]/mL (ref 0.450–4.500)

## 2024-04-30 ENCOUNTER — Encounter: Payer: Self-pay | Admitting: Physician Assistant

## 2024-05-01 ENCOUNTER — Ambulatory Visit: Payer: Self-pay | Admitting: Physician Assistant

## 2024-05-01 NOTE — Progress Notes (Signed)
 Avira,   TG improved.  HDL, good cholesterol, improved.  LDL, bad cholesterol, not to optimal goal.   Low 10 year cardiovascular risk which is great news.   Aaron Aas.The 10-year ASCVD risk score (Arnett DK, et al., 2019) is: 1.7%   Values used to calculate the score:     Age: 53 years     Sex: Female     Is Non-Hispanic African American: No     Diabetic: No     Tobacco smoker: No     Systolic Blood Pressure: 126 mmHg     Is BP treated: No     HDL Cholesterol: 41 mg/dL     Total Cholesterol: 186 mg/dL   No concerns with CBC.   Liver enzymes elevated some but stable.   Vitamin D  is too high. Need to cut back on this some. How muhc are you taking?   Thyroid normal.

## 2024-05-01 NOTE — Addendum Note (Signed)
 Addended by: Izora Marten on: 05/01/2024 09:28 AM   Modules accepted: Orders

## 2024-05-02 MED ORDER — BUPROPION HCL ER (XL) 150 MG PO TB24: 150.0000 mg | ORAL_TABLET | Freq: Every day | ORAL | 1 refills | Status: DC

## 2024-05-02 NOTE — Telephone Encounter (Signed)
 Med sent.  She will need to schedule follow-up with Jade in about 7 weeks to make sure that she is doing well, getting a good response, do some goalsetting and then adjust her dose if needed.  Meds ordered this encounter  Medications   buPROPion (WELLBUTRIN XL) 150 MG 24 hr tablet    Sig: Take 1 tablet (150 mg total) by mouth daily.    Dispense:  30 tablet    Refill:  1

## 2024-05-02 NOTE — Telephone Encounter (Signed)
 LM for pt to call the office to schedule f/u

## 2024-06-09 ENCOUNTER — Ambulatory Visit
Admission: RE | Admit: 2024-06-09 | Discharge: 2024-06-09 | Disposition: A | Discharge status: Home / Self Care | Source: Ambulatory Visit | Attending: Family Medicine | Admitting: Family Medicine

## 2024-06-09 VITALS — BP 139/85 | HR 64 | Temp 98.5°F | Resp 18 | Ht 68.0 in | Wt 175.0 lb

## 2024-06-09 DIAGNOSIS — R300 Dysuria: Secondary | ICD-10-CM | POA: Diagnosis not present

## 2024-06-09 LAB — POCT URINALYSIS DIP (MANUAL ENTRY)
Bilirubin, UA: NEGATIVE
Blood, UA: NEGATIVE
Glucose, UA: NEGATIVE mg/dL
Leukocytes, UA: NEGATIVE
Nitrite, UA: NEGATIVE
Protein Ur, POC: NEGATIVE mg/dL
Spec Grav, UA: >=1.03 — AB (ref 1.010–1.025)
Urobilinogen, UA: 0.2 U/dL
pH, UA: 5.5 (ref 5.0–8.0)

## 2024-06-09 MED ORDER — FLUCONAZOLE 150 MG PO TABS: 150.0000 mg | ORAL_TABLET | Freq: Every day | ORAL | 0 refills | Status: DC

## 2024-06-09 MED ORDER — NITROFURANTOIN MONOHYD MACRO 100 MG PO CAPS: 100.0000 mg | ORAL_CAPSULE | Freq: Two times a day (BID) | ORAL | 0 refills | Status: DC

## 2024-06-09 NOTE — ED Triage Notes (Signed)
 Patient feels that she has a bladder infection, urinary urgency, frequency and dysuria x 2 days.  Denies any hematuria.

## 2024-06-09 NOTE — Discharge Instructions (Signed)
 Continue to drink lots of water Take the nitrofurantoin  (Macrobid ) 2 times a day for 5 days This is a good antibiotic for urinary infections I have also prescribed Diflucan  in case it is needed Check MyChart for your urine culture report.  It should be available in 2 to 3 days A nurse will call you if any change in antibiotics is needed

## 2024-06-09 NOTE — ED Provider Notes (Signed)
 Summer Santiago    CSN: [id number] Arrival date & time: 06/09/24  1304      History   Chief Complaint Chief Complaint  Patient presents with   Urinary Frequency    Blade infection - Entered by patient    HPI Summer Santiago is a 53 y.o. female.   Patient has symptoms consistent with a urinary tract infection.  She has had these before.  Not very often.  She states that she has no abdominal pain, vaginal discharge, flank pain, fever chills nausea or vomiting.  She does have dysuria and frequency.    Past Medical History:  Diagnosis Date   Hyperlipidemia    Hypothyroidism    Thyroid disease     Patient Active Problem List   Diagnosis Date Noted   Overweight (BMI 25.0-29.9) 04/27/2024   Tuberculosis screening 04/01/2023   SOB (shortness of breath) on exertion 03/31/2023   Mixed hyperlipidemia 03/31/2023   Family history of CHF (congestive heart failure) 03/30/2023   At high risk for osteoporosis 03/30/2023   Pre-diabetes 03/30/2023   Psoriatic arthritis (HCC) 11/03/2021   Elevated liver enzymes 12/22/2020   Elevated fasting blood sugar 12/22/2020   Allergic reaction 12/19/2020   Elevated liver function tests 12/19/2020   Perioral dermatitis 06/09/2019   Epiploic appendagitis 12/23/2017   Breast mass, right 09/12/2017   Psoriasis 03/02/2016   Elevated BP 03/02/2016   Right shoulder pain 11/13/2015   Fibroids 05/09/2015   Uterine fibroid 03/03/2015   Pelvic mass in female 02/14/2015   Rosacea 11/13/2014   Vitamin D  deficiency 12/27/2013   Hypothyroidism 11/26/2013   Dyslipidemia (high LDL; low HDL) 11/26/2013   Insomnia 11/26/2013    Past Surgical History:  Procedure Laterality Date   ANTERIOR CRUCIATE LIGAMENT REPAIR     right   APPENDECTOMY     BILATERAL SALPINGECTOMY Bilateral 05/09/2015   Procedure: BILATERAL SALPINGECTOMY;  Surgeon: Burnard Bowers, MD;  Location: [facility];  Service: Gynecology;  Laterality: Bilateral;   BREAST REDUCTION SURGERY      REDUCTION MAMMAPLASTY Bilateral 2000   ROBOTIC ASSISTED TOTAL HYSTERECTOMY N/A 05/09/2015   Procedure: ROBOTIC ASSISTED TOTAL HYSTERECTOMY With Possible Power Morcellation;  Surgeon: Burnard Bowers, MD;  Location: [facility];  Service: Gynecology;  Laterality: N/A;    OB History   No obstetric history on file.      Home Medications    Prior to Admission medications   Medication Sig Start Date End Date Taking? Authorizing Provider  [id number] [facility]) 160 MG/ML pen 1 injection Subcutaneous every 4 weeks for 30 days 03/14/24  Yes [provider]  buPROPion  (WELLBUTRIN  XL) 150 MG 24 hr tablet Take 1 tablet (150 mg total) by mouth daily. 05/02/24  Yes Alvan Dorothyann BIRCH, MD  Cholecalciferol (VITAMIN D  PO) Take by mouth.   Yes [provider]  fexofenadine  (ALLEGRA  ALLERGY) 180 MG tablet Take 1 tablet (180 mg total) by mouth daily for 15 days. 04/10/22 04/27/25 Yes Teddy Sharper, FNP  fluconazole  (DIFLUCAN ) 150 MG tablet Take 1 tablet (150 mg total) by mouth daily. Repeat in 1 week if needed 06/09/24  Yes Maranda [name] Jacob, MD  levothyroxine  (SYNTHROID ) 100 MCG tablet Take 1 tablet (100 mcg total) by mouth daily. 10/26/23  Yes Breeback, Jade L, PA-C  methotrexate 50 MG/2ML injection Inject into the skin. 09/02/22  Yes [provider]  Multiple Vitamin (MULTIVITAMIN) tablet Take 1 tablet by mouth daily.   Yes [provider]  naproxen  (NAPROSYN ) 500 MG tablet Take 1 tablet (  500 mg total) by mouth 2 (two) times daily with a meal. 04/14/22  Yes Breeback, Jade L, PA-C  nitrofurantoin , macrocrystal-monohydrate, (MACROBID ) 100 MG capsule Take 1 capsule (100 mg total) by mouth 2 (two) times daily. 06/09/24  Yes Maranda [name] Jacob, MD  Pitavastatin  Calcium  (LIVALO ) 2 MG TABS Take 1 tablet (2 mg total) by mouth daily. 03/27/24  Yes Breeback, Jade L, PA-C  zolpidem  (AMBIEN ) 10 MG tablet TAKE 1 TABLET BY MOUTH AT  BEDTIME AS NEEDED FOR SLEEP 02/22/24  Yes Breeback, Vermell CROME,  PA-C    Family History Family History  Problem Relation Age of Onset   Congestive Heart Failure Mother    Gout Father    Cancer Sister    Diabetes Maternal Grandmother    Diabetes Paternal Grandmother    Cancer Paternal Grandmother        non-lymphoma of the brain   Stroke Paternal Grandfather     Social History Social History   Tobacco Use   Smoking status: Never   Smokeless tobacco: Never  Vaping Use   Vaping status: Never Used  Substance Use Topics   Alcohol use: Yes    Alcohol/week: 14.0 standard drinks of alcohol    Types: 14 Glasses of wine per week    Comment: daily wine   Drug use: No     Allergies   Atorvastatin, Neosporin [neomycin-bacitracin zn-polymyx], Simvastatin, and Tylenol  [acetaminophen ]   Review of Systems Review of Systems  See HPI Physical Exam Triage Vital Signs ED Triage Vitals  Encounter Vitals Group     BP 06/09/24 1316 139/85     Girls Systolic BP Percentile --      Girls Diastolic BP Percentile --      Boys Systolic BP Percentile --      Boys Diastolic BP Percentile --      Pulse Rate 06/09/24 1316 64     Resp 06/09/24 1316 18     Temp 06/09/24 1316 98.5 F (36.9 C)     Temp Source 06/09/24 1316 Oral     SpO2 06/09/24 1316 97 %     Weight 06/09/24 1318 175 lb (79.4 kg)     Height 06/09/24 1318 5' 8 (1.727 m)     Head Circumference --      Peak Flow --      Pain Score 06/09/24 1318 4     Pain Loc --      Pain Education --      Exclude from Growth Chart --    No data found.  Updated Vital Signs BP 139/85 (BP Location: Right Arm)   Pulse 64   Temp 98.5 F (36.9 C) (Oral)   Resp 18   Ht 5' 8 (1.727 m)   Wt 79.4 kg   LMP 04/29/2015 (Exact Date)   SpO2 97%   BMI 26.61 kg/m       Physical Exam Constitutional:      General: She is not in acute distress.    Appearance: She is well-developed and normal weight.  HENT:     Head: Normocephalic and atraumatic.  Eyes:     Conjunctiva/sclera: Conjunctivae normal.      Pupils: Pupils are equal, round, and reactive to light.  Cardiovascular:     Rate and Rhythm: Normal rate.  Pulmonary:     Effort: Pulmonary effort is normal. No respiratory distress.  Abdominal:     Tenderness: There is no right CVA tenderness or left CVA tenderness.  Musculoskeletal:  General: Normal range of motion.     Cervical back: Normal range of motion.  Skin:    General: Skin is warm and dry.  Neurological:     Mental Status: She is alert.      UC Treatments / Results  Labs (all labs ordered are listed, but only abnormal results are displayed) Labs Reviewed  POCT URINALYSIS DIP (MANUAL ENTRY) - Abnormal; Notable for the following components:      Result Value   Ketones, POC UA trace (5) (*)    Spec Grav, UA >=1.030 (*)    All other components within normal limits  URINE CULTURE    EKG   Radiology No results found.  Procedures Procedures (including critical Santiago time)  Medications Ordered in UC Medications - No data to display  Initial Impression / Assessment and Plan / UC Course  I have reviewed the triage vital signs and the nursing notes.  Pertinent labs & imaging results that were available during my Santiago of the patient were reviewed by me and considered in my medical decision making (see chart for details).     Patient's urinalysis is normal.  I would not treat her based on her symptoms.  Urine culture is pending.  Patient is advised that she can stop the antibiotic when her culture report is available, if negative.  We did discuss other causes of dysuria. Final Clinical Impressions(s) / UC Diagnoses   Final diagnoses:  Dysuria     Discharge Instructions      Continue to drink lots of water Take the nitrofurantoin  (Macrobid ) 2 times a day for 5 days This is a good antibiotic for urinary infections I have also prescribed Diflucan  in case it is needed Check MyChart for your urine culture report.  It should be available in 2 to 3  days A nurse will call you if any change in antibiotics is needed   ED Prescriptions     Medication Sig Dispense Auth. Provider   nitrofurantoin , macrocrystal-monohydrate, (MACROBID ) 100 MG capsule Take 1 capsule (100 mg total) by mouth 2 (two) times daily. 10 capsule Maranda [name] Jacob, MD   fluconazole  (DIFLUCAN ) 150 MG tablet Take 1 tablet (150 mg total) by mouth daily. Repeat in 1 week if needed 2 tablet Maranda [name] Jacob, MD      PDMP not reviewed this encounter.   Maranda [name] Jacob, MD 06/09/24 1341

## 2024-06-10 LAB — URINE CULTURE: Culture: NO GROWTH

## 2024-06-11 ENCOUNTER — Other Ambulatory Visit: Payer: Self-pay

## 2024-06-11 ENCOUNTER — Emergency Department (HOSPITAL_BASED_OUTPATIENT_CLINIC_OR_DEPARTMENT_OTHER)

## 2024-06-11 ENCOUNTER — Emergency Department (HOSPITAL_BASED_OUTPATIENT_CLINIC_OR_DEPARTMENT_OTHER)
Admission: EM | Admit: 2024-06-11 | Discharge: 2024-06-11 | Discharge status: Against Medical Advice | Attending: Emergency Medicine | Admitting: Emergency Medicine

## 2024-06-11 ENCOUNTER — Encounter (HOSPITAL_BASED_OUTPATIENT_CLINIC_OR_DEPARTMENT_OTHER): Payer: Self-pay

## 2024-06-11 DIAGNOSIS — R0789 Other chest pain: Secondary | ICD-10-CM | POA: Diagnosis present

## 2024-06-11 DIAGNOSIS — Z5321 Procedure and treatment not carried out due to patient leaving prior to being seen by health care provider: Secondary | ICD-10-CM | POA: Diagnosis not present

## 2024-06-11 HISTORY — DX: Arthropathic psoriasis, unspecified: L40.50

## 2024-06-11 LAB — BASIC METABOLIC PANEL WITH GFR
Anion gap: 14 (ref 5–15)
BUN: 13 mg/dL (ref 6–20)
CO2: 22 mmol/L (ref 22–32)
Calcium: 9.1 mg/dL (ref 8.9–10.3)
Chloride: 99 mmol/L (ref 98–111)
Creatinine, Ser: 0.62 mg/dL (ref 0.44–1.00)
GFR, Estimated: >60 mL/min (ref >60)
Glucose, Bld: 99 mg/dL (ref 70–99)
Potassium: 3.8 mmol/L (ref 3.5–5.1)
Sodium: 135 mmol/L (ref 135–145)

## 2024-06-11 LAB — CBC
HCT: 36.5 % (ref 36.0–46.0)
Hemoglobin: 11.8 g/dL — ABNORMAL LOW (ref 12.0–15.0)
MCH: 30 pg (ref 26.0–34.0)
MCHC: 32.3 g/dL (ref 30.0–36.0)
MCV: 92.9 fL (ref 80.0–100.0)
Platelets: 265 K/uL (ref 150–400)
RBC: 3.93 MIL/uL (ref 3.87–5.11)
RDW: 15.4 % (ref 11.5–15.5)
WBC: 6.9 K/uL (ref 4.0–10.5)
nRBC: 0 % (ref 0.0–0.2)

## 2024-06-11 LAB — TROPONIN T, HIGH SENSITIVITY: Troponin T High Sensitivity: <15 ng/L (ref <19)

## 2024-06-11 NOTE — ED Triage Notes (Signed)
 Pt reports centralized chest pain starting about 3 hours ago all of a sudden radiating up to her ears. Pain is tight/squeezing and constant. SOB with exertion. Denies weakness, fatigue, dizziness, N/V/D, fever.

## 2024-07-02 ENCOUNTER — Encounter: Payer: Self-pay | Admitting: Physician Assistant

## 2024-07-02 DIAGNOSIS — E782 Mixed hyperlipidemia: Secondary | ICD-10-CM

## 2024-07-02 MED ORDER — PITAVASTATIN CALCIUM 2 MG PO TABS: 1.0000 | ORAL_TABLET | Freq: Every day | ORAL | 3 refills | Status: DC

## 2024-07-06 ENCOUNTER — Other Ambulatory Visit: Payer: Self-pay | Admitting: Family Medicine

## 2024-07-09 ENCOUNTER — Telehealth: Payer: Self-pay

## 2024-07-09 NOTE — Telephone Encounter (Signed)
 Copied from CRM [id number]. Topic: Clinical - Lab/Test Results >> Jul 09, 2024  2:09 PM Merlynn A wrote: Reason for CRM: [facility] called in requesting to have patients last lipid panel faxed over to them at [phone]. Please fax over. Any questions please contact [phone] ext [phone] ask for Cindy. Patients insurance has denied her medicine so results are needed to change medication.

## 2024-09-07 ENCOUNTER — Encounter: Payer: Self-pay | Admitting: Physician Assistant

## 2024-09-07 DIAGNOSIS — F5101 Primary insomnia: Secondary | ICD-10-CM

## 2024-09-07 DIAGNOSIS — E039 Hypothyroidism, unspecified: Secondary | ICD-10-CM

## 2024-09-10 MED ORDER — LEVOTHYROXINE SODIUM 100 MCG PO TABS: 100.0000 ug | ORAL_TABLET | Freq: Every day | ORAL | 2 refills | Status: AC

## 2024-09-10 MED ORDER — ZOLPIDEM TARTRATE 10 MG PO TABS: ORAL_TABLET | ORAL | 0 refills | Status: AC

## 2024-10-26 ENCOUNTER — Ambulatory Visit: Admitting: Physician Assistant

## 2024-11-23 ENCOUNTER — Ambulatory Visit: Admitting: Physician Assistant

## 2024-11-23 VITALS — BP 120/79 | HR 74 | Ht 68.0 in | Wt 176.0 lb

## 2024-11-23 DIAGNOSIS — L4050 Arthropathic psoriasis, unspecified: Secondary | ICD-10-CM | POA: Diagnosis not present

## 2024-11-23 DIAGNOSIS — E039 Hypothyroidism, unspecified: Secondary | ICD-10-CM | POA: Diagnosis not present

## 2024-11-23 DIAGNOSIS — E782 Mixed hyperlipidemia: Secondary | ICD-10-CM | POA: Diagnosis not present

## 2024-11-23 DIAGNOSIS — E559 Vitamin D deficiency, unspecified: Secondary | ICD-10-CM

## 2024-11-23 DIAGNOSIS — E663 Overweight: Secondary | ICD-10-CM

## 2024-11-23 DIAGNOSIS — F5101 Primary insomnia: Secondary | ICD-10-CM | POA: Diagnosis not present

## 2024-11-23 DIAGNOSIS — J3489 Other specified disorders of nose and nasal sinuses: Secondary | ICD-10-CM | POA: Diagnosis not present

## 2024-11-23 DIAGNOSIS — R7303 Prediabetes: Secondary | ICD-10-CM | POA: Diagnosis not present

## 2024-11-23 DIAGNOSIS — T17500A Unspecified foreign body in bronchus causing asphyxiation, initial encounter: Secondary | ICD-10-CM | POA: Insufficient documentation

## 2024-11-23 LAB — POCT GLYCOSYLATED HEMOGLOBIN (HGB A1C): Hemoglobin A1C: 5.1 % (ref 4.0–5.6)

## 2024-11-23 MED ORDER — FAMOTIDINE 20 MG PO TABS: 20.0000 mg | ORAL_TABLET | Freq: Two times a day (BID) | ORAL | 5 refills | Status: AC

## 2024-11-23 MED ORDER — ZOLPIDEM TARTRATE 10 MG PO TABS: ORAL_TABLET | ORAL | 1 refills | Status: AC

## 2024-11-23 MED ORDER — LEVOTHYROXINE SODIUM 100 MCG PO TABS: 100.0000 ug | ORAL_TABLET | Freq: Every day | ORAL | 1 refills | Status: AC

## 2024-11-24 LAB — CBC WITH DIFFERENTIAL/PLATELET
Basophils Absolute: 0 x10E3/uL (ref 0.0–0.2)
Basos: 0 %
EOS (ABSOLUTE): 0.1 x10E3/uL (ref 0.0–0.4)
Eos: 2 %
Hematocrit: 37.6 % (ref 34.0–46.6)
Hemoglobin: 12.4 g/dL (ref 11.1–15.9)
Immature Grans (Abs): 0 x10E3/uL (ref 0.0–0.1)
Immature Granulocytes: 0 %
Lymphocytes Absolute: 0.6 x10E3/uL — ABNORMAL LOW (ref 0.7–3.1)
Lymphs: 12 %
MCH: 32.6 pg (ref 26.6–33.0)
MCHC: 33 g/dL (ref 31.5–35.7)
MCV: 99 fL — ABNORMAL HIGH (ref 79–97)
Monocytes Absolute: 0.3 x10E3/uL (ref 0.1–0.9)
Monocytes: 6 %
Neutrophils Absolute: 3.7 x10E3/uL (ref 1.4–7.0)
Neutrophils: 79 %
Platelets: 187 x10E3/uL (ref 150–450)
RBC: 3.8 x10E6/uL (ref 3.77–5.28)
RDW: 15.9 % — ABNORMAL HIGH (ref 11.7–15.4)
WBC: 4.7 x10E3/uL (ref 3.4–10.8)

## 2024-11-24 LAB — CMP14+EGFR
ALT: 21 IU/L (ref 0–32)
AST: 23 IU/L (ref 0–40)
Albumin: 3.9 g/dL (ref 3.8–4.9)
Alkaline Phosphatase: 135 IU/L (ref 49–135)
BUN/Creatinine Ratio: 20 (ref 9–23)
BUN: 15 mg/dL (ref 6–24)
Bilirubin Total: 0.3 mg/dL (ref 0.0–1.2)
CO2: 21 mmol/L (ref 20–29)
Calcium: 8.8 mg/dL (ref 8.7–10.2)
Chloride: 105 mmol/L (ref 96–106)
Creatinine, Ser: 0.75 mg/dL (ref 0.57–1.00)
Globulin, Total: 3 g/dL (ref 1.5–4.5)
Glucose: 93 mg/dL (ref 70–99)
Potassium: 4.7 mmol/L (ref 3.5–5.2)
Sodium: 141 mmol/L (ref 134–144)
Total Protein: 6.9 g/dL (ref 6.0–8.5)
eGFR: 96 mL/min/1.73

## 2024-11-24 LAB — TSH+FREE T4
Free T4: 1.23 ng/dL (ref 0.82–1.77)
TSH: 3.13 u[IU]/mL (ref 0.450–4.500)

## 2024-11-24 LAB — LIPID PANEL
Chol/HDL Ratio: 3.4 ratio (ref 0.0–4.4)
Cholesterol, Total: 182 mg/dL (ref 100–199)
HDL: 54 mg/dL
LDL Chol Calc (NIH): 110 mg/dL — ABNORMAL HIGH (ref 0–99)
Triglycerides: 98 mg/dL (ref 0–149)
VLDL Cholesterol Cal: 18 mg/dL (ref 5–40)

## 2024-11-26 ENCOUNTER — Encounter: Payer: Self-pay | Admitting: Physician Assistant

## 2024-11-26 ENCOUNTER — Ambulatory Visit: Payer: Self-pay | Admitting: Physician Assistant

## 2024-11-26 MED ORDER — PITAVASTATIN CALCIUM 4 MG PO TABS: 1.0000 | ORAL_TABLET | Freq: Every day | ORAL | 1 refills | Status: AC

## 2024-11-26 NOTE — Progress Notes (Signed)
 "  Established Patient Office Visit  Subjective   Patient ID: Summer Santiago, female    DOB: 1971-03-12  Age: 53 y.o. MRN: [id number]  Chief Complaint  Patient presents with   Follow-up    31m f/u - Pre- DM / HTN / HLD Last A1c 53m ago - 5.7 Today - 5.1    HPI .SABRADiscussed the use of AI scribe software for clinical note transcription with the patient, who gave verbal consent to proceed.  History of Present Illness Summer Santiago is a 53 year old female with Psoriatic Arthritis, Pre-diabetes, Dyslipidemia, hypothyroidism, Insomnia who presents for medication management and a refill request.  Medication management - Currently taking Synthroid  for thyroid management, Ambien  for insomnia, and methotrexate. - Ambien  is effective for insomnia. - Requests medication refills. - Patient sees rheumatology for Psoriatic Arthritis managagement.   Dyslipidemia and statin intolerance - Experienced intolerance to simvastatin and atorvastatin.  - Able to tolerate Livalo , but patient states insurance will not cover it next year.   Chronic throat mucus - Thick mucus in the posterior pharynx every morning, improves after drinking hot coffee. - Tried nasal sprays and Mucinex without benefit. - No cough, shortness of breath, or chest pain. - No congestion, but rhinorrhea occurs when exposed to cold air. - No other symptoms and does not feel ill. - Has not tried acid reflux medication or azithromycin , but has occasionally taken her husband's azithromycin .    ROS See HPI.    Objective:     BP 120/79 (BP Location: Right Arm, Patient Position: Sitting, Cuff Size: Normal)   Pulse 74   Ht 5' 8 (1.727 m)   Wt 176 lb (79.8 kg)   LMP 04/29/2015   SpO2 100%   BMI 26.76 kg/m  BP Readings from Last 3 Encounters:  11/23/24 120/79  06/11/24 (!) 126/90  06/09/24 139/85   Wt Readings from Last 3 Encounters:  11/23/24 176 lb (79.8 kg)  06/11/24 175 lb (79.4 kg)  06/09/24 175 lb (79.4 kg)  .SABRA Lab  Results  Component Value Date   HGBA1C 5.1 11/23/2024       Physical Exam Constitutional:      Appearance: Normal appearance.  HENT:     Head: Normocephalic.  Cardiovascular:     Rate and Rhythm: Normal rate and regular rhythm.  Pulmonary:     Effort: Pulmonary effort is normal.     Breath sounds: Normal breath sounds.  Musculoskeletal:     Right lower leg: No edema.     Left lower leg: No edema.  Neurological:     General: No focal deficit present.     Mental Status: She is alert and oriented to person, place, and time.  Psychiatric:        Mood and Affect: Mood normal.         The 10-year ASCVD risk score (Arnett DK, et al., 2019) is: 1.2%    Assessment & Plan:  SABRASABRAElainna was seen today for follow-up.  Diagnoses and all orders for this visit:  Acquired hypothyroidism -     levothyroxine  (SYNTHROID ) 100 MCG tablet; Take 1 tablet (100 mcg total) by mouth daily. -     TSH + free T4  Mixed hyperlipidemia -     Lipid panel -     AMB Referral VBCI Care Management  Pre-diabetes -     POCT HgB A1C -     CMP14+EGFR  Vitamin D  deficiency  Overweight (BMI 25.0-29.9)  Primary insomnia -  zolpidem  (AMBIEN ) 10 MG tablet; TAKE 1 TABLET BY MOUTH AT  BEDTIME AS NEEDED FOR SLEEP  Thick nasal mucus -     CBC w/Diff/Platelet -     famotidine  (PEPCID ) 20 MG tablet; Take 1 tablet (20 mg total) by mouth 2 (two) times daily.  Psoriatic arthritis (HCC)   Assessment & Plan Chronic throat mucus and morning hoarseness Possibly related to acid reflux or allergic response. Previous nasal spray and Mucinex were ineffective. - Prescribed Pepcid  twice daily for one month.   Mixed hyperlipidemia Management complicated by insurance issues. Previous statin not tolerated; current statin, Livalo , not covered. Discussed prior authorization and Repatha  as alternatives. - Submitted referral to pharmacy to help with approval of Livalo . - Consider Repatha  in combination with or  monotherapy if not willing to approve Livalo .   Acquired hypothyroidism Managed with Synthroid . - Will check labs and adjust accordingly.   Primary insomnia Managed with Ambien , effective. - Refilled Ambien  prescription.  Pre-diabetes A1c within target range. - Continue with low sugar and carb diet with regular exercise - Recheck in 6 months.   General health maintenance Due for routine laboratory tests. - Ordered routine laboratory tests. - Declined pneumonia vaccine today.      Return in about 6 months (around 05/24/2025).    Jai Bear, PA-C  "

## 2024-11-26 NOTE — Progress Notes (Signed)
 Summer Santiago,   I would like to get LDL under 100. I would like to increase livalo  to 4g daily. I sent new rx to see if we can get 4g approved before end of year while we are working on the approval process.   Thyroid looks good.

## 2024-11-30 ENCOUNTER — Telehealth: Payer: Self-pay

## 2024-11-30 NOTE — Progress Notes (Signed)
 Care Guide Pharmacy Note  11/30/2024 Name: Summer Santiago MRN: [id number] DOB: Jan 04, 1971  Referred By: Antoniette Vermell CROME, PA-C Reason for referral: Complex Care Management (Outreach to schedule with pharm d )   Summer Santiago is a 53 y.o. year old female who is a primary care patient of Breeback, Jade L, PA-C.  Summer Santiago was referred to the pharmacist for assistance related to: HLD  An unsuccessful telephone outreach was attempted today to contact the patient who was referred to the pharmacy team for assistance with medication assistance. Additional attempts will be made to contact the patient.  Jeoffrey Buffalo , RMA     [facility]  [facility], [facility] Guide  Direct Dial: [phone]  Website: [email or web address]

## 2024-12-03 ENCOUNTER — Telehealth: Payer: Self-pay | Admitting: Pharmacy Technician

## 2024-12-03 ENCOUNTER — Other Ambulatory Visit (HOSPITAL_COMMUNITY): Payer: Self-pay

## 2024-12-03 NOTE — Telephone Encounter (Signed)
-----   Message from Nurse Suzen DEL sent at 11/27/2024 11:52 AM EST -----  ----- Message ----- From: Antoniette Vermell LITTIE DEVONNA Sent: 11/26/2024   7:54 AM EST To: [facility]  Summer Santiago,   I would like to get LDL under 100. I would like to increase livalo  to 4g daily. I sent new rx to see if we can get 4g approved before end of year while we are working on the approval process.   Thyroid looks good.

## 2024-12-03 NOTE — Telephone Encounter (Signed)
 Pharmacy Patient Advocate Encounter   Insurance verification completed.   The patient is insured through [employer].   Per test claim: The current 90 day co-pay is, $24.  No PA needed at this time. This test claim was processed through [facility]- copay amounts may vary at other pharmacies due to pharmacy/plan contracts, or as the patient moves through the different stages of their insurance plan.     Called the pharmacy - Pt got a 9 day supply of 2mg  in Oct, so she'll likely have to double on it, at least for a bit b/f they can fill the 4mg . He said they did not receive the new prescription, so you may need to resend it. Thanks.

## 2024-12-03 NOTE — Telephone Encounter (Signed)
 Pharmacy Patient Advocate Encounter   Received notification from Results Follow-up messages that prior authorization for Livalo  4mg  is required/requested.   No insurance found using [employer] & the image of the pt's card only has her health coverage, not pharmacy. Sent a [employer] to the patient asking for pharmacy insurance information.

## 2024-12-04 NOTE — Progress Notes (Signed)
 Your insurance has approved the authorization for the Livalo  4 mg rx. Please contact your pharmacy regarding your medication.    Pharmacy Patient Advocate Encounter   Insurance verification completed. The patient is insured through [employer].   Per test claim: The current 90 day co-pay is, $24.  No PA needed at this time. This test claim was processed through [facility]- copay amounts may vary at other pharmacies due to pharmacy/plan contracts, or as the patient moves through the different stages of their insurance plan. Called the pharmacy, was informed the patient got a 90 day supply of 2 mg in Oct, so she'll likely have to double on it, at least for a bit b/f they can fill the 4mg .

## 2024-12-05 ENCOUNTER — Other Ambulatory Visit (HOSPITAL_COMMUNITY): Payer: Self-pay

## 2024-12-13 NOTE — Progress Notes (Unsigned)
 Care Guide Pharmacy Note  12/13/2024 Name: Summer Santiago MRN: [id number] DOB: Oct 26, 1971  Referred By: Antoniette Vermell CROME, PA-C Reason for referral: Complex Care Management (Outreach to schedule with pharm d )   Summer Santiago is a 54 y.o. year old female who is a primary care patient of Breeback, Jade L, PA-C.  Summer Santiago was referred to the pharmacist for assistance related to: HLD  A second unsuccessful telephone outreach was attempted today to contact the patient who was referred to the pharmacy team for assistance with medication assistance. Additional attempts will be made to contact the patient.  Jeoffrey Buffalo , RMA     [facility]  [facility], [facility] Guide  Direct Dial: [phone]  Website: [email or web address]

## 2024-12-17 NOTE — Progress Notes (Signed)
 Care Guide Pharmacy Note  12/17/2024 Name: Summer Santiago MRN: [id number] DOB: Mar 21, 1971  Referred By: Antoniette Vermell CROME, PA-C Reason for referral: Complex Care Management (Outreach to schedule with pharm d )   Summer Santiago is a 54 y.o. year old female who is a primary care patient of Breeback, Jade L, PA-C.  Summer Santiago was referred to the pharmacist for assistance related to: HLD  Successful contact was made with the patient to discuss pharmacy services including being ready for the pharmacist to call at least 5 minutes before the scheduled appointment time and to have medication bottles and any blood pressure readings ready for review. The patient agreed to meet with the pharmacist via telephone visit on (date/time).01/01/2025  Jeoffrey Buffalo , RMA     [place]  [facility], [facility] Guide  Direct Dial: [phone]  Website: [email or web address]

## 2025-01-01 ENCOUNTER — Other Ambulatory Visit (INDEPENDENT_AMBULATORY_CARE_PROVIDER_SITE_OTHER): Payer: Self-pay

## 2025-01-01 DIAGNOSIS — E785 Hyperlipidemia, unspecified: Secondary | ICD-10-CM

## 2025-01-01 NOTE — Progress Notes (Signed)
 "  01/01/2025 Name: Summer Santiago MRN: [id number] DOB: 27-Sep-1971  Chief Complaint  Patient presents with   Medication Assistance   AILEENE LANUM is a 55 y.o. year old female who presented for a telephone visit.   They were referred to the pharmacist by their PCP for assistance in managing hyperlipidemia/cardiovascular risk reduction and medication access.   Subjective:  Care Team: Primary Care Provider: Breeback, Jade L, PA-C ; Next Scheduled Visit: 05/24/25  Medication Access/Adherence  Current Pharmacy:  [id number] - [place], [place] - [address] [place] [address] Phone: [phone] Fax: [phone]  Patient reports affordability concerns with their medications: Yes  Patient reports access/transportation concerns to their pharmacy: No  Patient reports adherence concerns with their medications:  No    Hyperlipidemia/ASCVD Risk Reduction Current lipid lowering medications: pitavastatin  4mg  daily -Medications tried in the past: has not been able to tolerate other statins due to myalgias -Pitavastatin  dose recently increased to 4mg  from 2mg , and medication was >$500 for patient to fill a 90 day supply earlier this month -Test claim in December reflected a $24 copay, so this was likely due to a deductible on her plan that needed to be met after the 1st of the year   Antiplatelet regimen: none  ASCVD History: none noted Family History: CHF, diabetes, stroke Risk Factors: hyperlipidemia  Current medication access support: none  PREVENT 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 Sig Documenting Provider Last Dose Status Informant  Cholecalciferol (VITAMIN D  PO) [id number]  Take by mouth. [provider]  Active   famotidine  (PEPCID ) 20 MG tablet [id number]  Take 1 tablet (20 mg total) by mouth 2 (two) times daily. Antoniette Vermell CROME, PA-C  Active   fexofenadine  (ALLEGRA  ALLERGY) 180 MG tablet [id number]  Take 1 tablet (180 mg total) by mouth daily for 15 days. Teddy Sharper, FNP  Active   folic acid (FOLVITE) 1 MG tablet [id number]  Take 2-3 mg by mouth daily. [provider]  Active   levothyroxine  (SYNTHROID ) 100 MCG tablet [id number]  Take 1 tablet (100 mcg total) by mouth daily. Breeback, Jade L, PA-C  Active   methotrexate 50 MG/2ML injection [id number]  Inject into the skin. [provider]  Active   Multiple Vitamin (MULTIVITAMIN) tablet [id number]  Take 1 tablet by mouth daily. [provider]  Active   naproxen  (NAPROSYN ) 500 MG tablet [id number]  Take 1 tablet (500 mg total) by mouth 2 (two) times daily with a meal. Breeback, Jade L, PA-C  Active   Pitavastatin  Calcium  4 MG TABS [id number] Yes Take 1 tablet (4 mg total) by mouth daily. Breeback, Jade L, PA-C  Active   predniSONE  (DELTASONE ) 5 MG  tablet [id number]  Take 5 mg by mouth daily as needed. [provider]  Active   RINVOQ 15 MG [id number]  Take 1 tablet by mouth daily. [provider]  Active   zolpidem  (AMBIEN ) 10 MG tablet [id number]  TAKE 1 TABLET BY MOUTH AT  BEDTIME AS NEEDED FOR SLEEP Breeback, Jade L, PA-C  Active            Assessment/Plan:   Hyperlipidemia/ASCVD Risk Reduction: -Currently controlled, but I  expect improvement with dose increase to pitavastatin  4mg  daily -I recommend that patient continue current regimen -Contacted [facility] to verify insurance billed correctly for pitavasatatin 4mg  earlier this month.  It appears to have been, but likely that patient had a deductible at the first of the year.  Test claim now reflects refills moving forward will be $24 for a 90 day supply.  Insurance will not cover refill until 3/15, but I will contact pharmacy then to refill and verify cost. -Will contact insurance to verify expensive copay earlier this month was due to deductible, check deductible amount, and verify this has been met -Copay card is available for brand name Livalo , but patient's insurance does not cover brand name  Follow Up Plan: Will contact pharmacy to verify $24 copay for next refill on 3/16 and will follow-up with patient  Channing DELENA Mealing, PharmD, DPLA    "

## 2025-02-18 ENCOUNTER — Other Ambulatory Visit

## 2025-05-24 ENCOUNTER — Ambulatory Visit: Admitting: Physician Assistant
